# Patient Record
Sex: Female | Born: 1955
Health system: Southern US, Community
[De-identification: ages and names within clinical notes are randomized; demographics above are authoritative.]

## PROBLEM LIST (undated history)

## (undated) DIAGNOSIS — Z789 Other specified health status: Secondary | ICD-10-CM

## (undated) DIAGNOSIS — T7840XA Allergy, unspecified, initial encounter: Secondary | ICD-10-CM

## (undated) DIAGNOSIS — J45909 Unspecified asthma, uncomplicated: Secondary | ICD-10-CM

## (undated) HISTORY — PX: ABDOMINAL HYSTERECTOMY: SHX81

## (undated) HISTORY — DX: Allergy, unspecified, initial encounter: T78.40XA

---

## 2003-11-20 ENCOUNTER — Ambulatory Visit: Payer: Self-pay | Admitting: Obstetrics and Gynecology

## 2003-11-30 ENCOUNTER — Ambulatory Visit: Payer: Self-pay | Admitting: Internal Medicine

## 2003-12-07 ENCOUNTER — Ambulatory Visit: Payer: Self-pay | Admitting: Obstetrics and Gynecology

## 2005-09-25 ENCOUNTER — Ambulatory Visit: Payer: Self-pay

## 2006-10-23 ENCOUNTER — Ambulatory Visit: Payer: Self-pay | Admitting: Obstetrics and Gynecology

## 2006-10-26 ENCOUNTER — Ambulatory Visit: Payer: Self-pay | Admitting: Obstetrics and Gynecology

## 2007-07-13 ENCOUNTER — Ambulatory Visit: Payer: Self-pay | Admitting: Family Medicine

## 2007-10-26 ENCOUNTER — Ambulatory Visit: Payer: Self-pay | Admitting: Obstetrics and Gynecology

## 2008-11-07 ENCOUNTER — Ambulatory Visit: Payer: Self-pay | Admitting: Obstetrics and Gynecology

## 2009-05-30 ENCOUNTER — Encounter: Payer: Self-pay | Admitting: General Practice

## 2009-06-13 ENCOUNTER — Ambulatory Visit: Payer: Self-pay

## 2009-06-17 ENCOUNTER — Encounter: Payer: Self-pay | Admitting: General Practice

## 2009-11-13 ENCOUNTER — Ambulatory Visit: Payer: Self-pay | Admitting: Obstetrics and Gynecology

## 2010-12-10 ENCOUNTER — Ambulatory Visit: Payer: Self-pay | Admitting: Obstetrics and Gynecology

## 2011-12-11 ENCOUNTER — Ambulatory Visit: Payer: Self-pay | Admitting: Obstetrics and Gynecology

## 2013-02-01 ENCOUNTER — Ambulatory Visit: Payer: Self-pay | Admitting: Obstetrics and Gynecology

## 2015-01-24 ENCOUNTER — Other Ambulatory Visit: Payer: Self-pay | Admitting: Obstetrics and Gynecology

## 2015-01-24 DIAGNOSIS — Z1231 Encounter for screening mammogram for malignant neoplasm of breast: Secondary | ICD-10-CM

## 2015-01-24 LAB — HM PAP SMEAR: HM Pap smear: NEGATIVE

## 2015-02-01 ENCOUNTER — Ambulatory Visit
Admission: RE | Admit: 2015-02-01 | Discharge: 2015-02-01 | Disposition: A | Discharge status: Home / Self Care | Payer: BLUE CROSS/BLUE SHIELD | Source: Ambulatory Visit | Attending: Obstetrics and Gynecology | Admitting: Obstetrics and Gynecology

## 2015-02-01 DIAGNOSIS — Z1231 Encounter for screening mammogram for malignant neoplasm of breast: Secondary | ICD-10-CM | POA: Insufficient documentation

## 2015-04-12 ENCOUNTER — Encounter: Payer: Self-pay | Admitting: *Deleted

## 2015-04-13 ENCOUNTER — Ambulatory Visit: Payer: BLUE CROSS/BLUE SHIELD | Admitting: Anesthesiology

## 2015-04-13 ENCOUNTER — Ambulatory Visit
Admission: RE | Admit: 2015-04-13 | Discharge: 2015-04-13 | Disposition: A | Discharge status: Home / Self Care | Payer: BLUE CROSS/BLUE SHIELD | Source: Ambulatory Visit | Attending: Gastroenterology | Admitting: Gastroenterology

## 2015-04-13 ENCOUNTER — Encounter: Payer: Self-pay | Admitting: Anesthesiology

## 2015-04-13 ENCOUNTER — Encounter
Admission: RE | Disposition: A | Discharge status: Home / Self Care | Payer: Self-pay | Source: Ambulatory Visit | Attending: Gastroenterology

## 2015-04-13 DIAGNOSIS — G4730 Sleep apnea, unspecified: Secondary | ICD-10-CM | POA: Insufficient documentation

## 2015-04-13 DIAGNOSIS — Z79899 Other long term (current) drug therapy: Secondary | ICD-10-CM | POA: Insufficient documentation

## 2015-04-13 DIAGNOSIS — Z6841 Body Mass Index (BMI) 40.0 and over, adult: Secondary | ICD-10-CM | POA: Diagnosis not present

## 2015-04-13 DIAGNOSIS — Z7982 Long term (current) use of aspirin: Secondary | ICD-10-CM | POA: Insufficient documentation

## 2015-04-13 DIAGNOSIS — Z1211 Encounter for screening for malignant neoplasm of colon: Secondary | ICD-10-CM | POA: Insufficient documentation

## 2015-04-13 HISTORY — DX: Other specified health status: Z78.9

## 2015-04-13 HISTORY — PX: COLONOSCOPY WITH PROPOFOL: SHX5780

## 2015-04-13 SURGERY — COLONOSCOPY WITH PROPOFOL
Anesthesia: General

## 2015-04-13 MED ORDER — FENTANYL CITRATE (PF) 100 MCG/2ML IJ SOLN
INTRAMUSCULAR | Status: DC | PRN
  Administered 2015-04-13: 50 ug via INTRAVENOUS

## 2015-04-13 MED ORDER — SODIUM CHLORIDE 0.9 % IV SOLN
INTRAVENOUS | Status: DC
  Administered 2015-04-13: 1000 mL via INTRAVENOUS

## 2015-04-13 MED ORDER — MIDAZOLAM HCL 5 MG/5ML IJ SOLN
INTRAMUSCULAR | Status: DC | PRN
  Administered 2015-04-13: 1 mg via INTRAVENOUS

## 2015-04-13 MED ORDER — SODIUM CHLORIDE 0.9 % IV SOLN: INTRAVENOUS | Status: DC

## 2015-04-13 MED ORDER — PROPOFOL 500 MG/50ML IV EMUL
INTRAVENOUS | Status: DC | PRN
  Administered 2015-04-13: 120 ug/kg/min via INTRAVENOUS

## 2015-04-13 MED ORDER — PROPOFOL 10 MG/ML IV BOLUS
INTRAVENOUS | Status: DC | PRN
  Administered 2015-04-13: 40 mg via INTRAVENOUS

## 2015-04-13 NOTE — Anesthesia Postprocedure Evaluation (Signed)
Anesthesia Post Note  Patient: Meghan Leon  Procedure(s) Performed: Procedure(s) (LRB): COLONOSCOPY WITH PROPOFOL (N/A)  Patient location during evaluation: Endoscopy Anesthesia Type: General Level of consciousness: awake and alert Pain management: pain level controlled Vital Signs Assessment: post-procedure vital signs reviewed and stable Respiratory status: spontaneous breathing, nonlabored ventilation, respiratory function stable and patient connected to nasal cannula oxygen Cardiovascular status: blood pressure returned to baseline and stable Postop Assessment: no signs of nausea or vomiting Anesthetic complications: no    Last Vitals:  Filed Vitals:   04/13/15 1240 04/13/15 1250  BP: 150/94 147/98  Pulse: 67 65  Temp:    Resp: 15 15    Last Pain:  Filed Vitals:   04/13/15 1251  PainSc: Asleep                 Cleda Mccreedy Tyrian Peart

## 2015-04-13 NOTE — Anesthesia Preprocedure Evaluation (Signed)
Anesthesia Evaluation  Patient identified by MRN, date of birth, ID band Patient awake    Reviewed: Allergy & Precautions, H&P , NPO status , Patient's Chart, lab work & pertinent test results  History of Anesthesia Complications Negative for: history of anesthetic complications  Airway Mallampati: III  TM Distance: >3 FB Neck ROM: full    Dental  (+) Poor Dentition   Pulmonary neg pulmonary ROS, neg shortness of breath,    Pulmonary exam normal breath sounds clear to auscultation       Cardiovascular Exercise Tolerance: Good (-) angina(-) Past MI and (-) DOE negative cardio ROS Normal cardiovascular exam Rhythm:regular Rate:Normal     Neuro/Psych negative neurological ROS  negative psych ROS   GI/Hepatic negative GI ROS, Neg liver ROS, neg GERD  ,  Endo/Other  negative endocrine ROS  Renal/GU negative Renal ROS  negative genitourinary   Musculoskeletal   Abdominal   Peds  Hematology negative hematology ROS (+)   Anesthesia Other Findings Past Medical History:   Morbid obesity  Signs and symptoms suggestive of sleep apnea                              Past Surgical History:   CESAREAN SECTION                                             BMI    Body Mass Index   42.16 kg/m 2      Reproductive/Obstetrics negative OB ROS                             Anesthesia Physical Anesthesia Plan  ASA: III  Anesthesia Plan: General   Post-op Pain Management:    Induction:   Airway Management Planned:   Additional Equipment:   Intra-op Plan:   Post-operative Plan:   Informed Consent: I have reviewed the patients History and Physical, chart, labs and discussed the procedure including the risks, benefits and alternatives for the proposed anesthesia with the patient or authorized representative who has indicated his/her understanding and acceptance.   Dental Advisory Given  Plan  Discussed with: Anesthesiologist, CRNA and Surgeon  Anesthesia Plan Comments:         Anesthesia Quick Evaluation

## 2015-04-13 NOTE — H&P (Signed)
Outpatient short stay form Pre-procedure 04/13/2015 11:45 AM Christena Deem MD  Primary Physician: Dr. Jennell Corner  Reason for visit:  Screening colonoscopy  History of present illness:  Patient is a 60 year old female presenting today for colonoscopy. She had another colonoscopy in 2005 with no polyps. There is no family history of colon cancer colon polyps. She does take an 81 mg aspirin tablet has held that for number days. She takes no blood thinning products. She tolerated her prep well.    Current facility-administered medications:  .  0.9 %  sodium chloride infusion, , Intravenous, Continuous, Christena Deem, MD, Last Rate: 20 mL/hr at 04/13/15 1044, 1,000 mL at 04/13/15 1044 .  0.9 %  sodium chloride infusion, , Intravenous, Continuous, Christena Deem, MD  Prescriptions prior to admission  Medication Sig Dispense Refill Last Dose  . aspirin EC 81 MG tablet Take 81 mg by mouth daily.   04/12/2015 at Unknown time  . loratadine (CLARITIN) 10 MG tablet Take 10 mg by mouth daily as needed for allergies.   04/12/2015 at Unknown time     No Known Allergies   Past Medical History  Diagnosis Date  . Medical history non-contributory     Review of systems:      Physical Exam    Heart and lungs: Regular rate and rhythm without rub or gallop, lungs are bilaterally clear.    HEENT: Normocephalic atraumatic eyes are anicteric    Other:     Pertinant exam for procedure: Soft nontender nondistended bowel sounds positive normoactive.    Planned proceedures: Anoscopy and indicated procedures. I have discussed the risks benefits and complications of procedures to include not limited to bleeding, infection, perforation and the risk of sedation and the patient wishes to proceed.    Christena Deem, MD Gastroenterology 04/13/2015  11:45 AM

## 2015-04-13 NOTE — Op Note (Signed)
[facility] Gastroenterology Patient Name: David Towson Procedure Date: 04/13/2015 11:46 AM MRN: [id number] Account #: [id number] Date of Birth: August 19, 1955 Admit Type: Outpatient Age: 60 Room: [facility] ENDO ROOM 3 Gender: Female Note Status: Finalized Procedure:            Colonoscopy Indications:          Screening for colorectal malignant neoplasm Providers:            Christena Deem, MD Referring MD:         Suzy Bouchard, MD (Referring MD) Medicines:            Monitored Anesthesia Care Complications:        No immediate complications. Procedure:            Pre-Anesthesia Assessment:                       - ASA Grade Assessment: II - A patient with mild                        systemic disease.                       After obtaining informed consent, the colonoscope was                        passed under direct vision. Throughout the procedure,                        the patient's blood pressure, pulse, and oxygen                        saturations were monitored continuously. The                        Colonoscope was introduced through the anus and                        advanced to the the cecum, identified by appendiceal                        orifice and ileocecal valve. The colonoscopy was                        performed with moderate difficulty due to a tortuous                        colon. Successful completion of the procedure was aided                        by changing the patient to a prone position and using                        manual pressure. The quality of the bowel preparation                        was good. Findings:      The colon (entire examined portion) was moderately tortuous.      The digital rectal exam was normal.      The exam was otherwise without abnormality.      The retroflexed view of the  distal rectum and anal verge was normal and       showed no anal or rectal abnormalities. Impression:           - Tortuous  colon.                       - The examination was otherwise normal.                       - The distal rectum and anal verge are normal on                        retroflexion view.                       - No specimens collected. Recommendation:       - Discharge patient to home. Procedure Code(s):    --- Professional ---                       [phone], Colonoscopy, flexible; diagnostic, including                        collection of specimen(s) by brushing or washing, when                        performed (separate procedure) Diagnosis Code(s):    --- Professional ---                       Z12.11, Encounter for screening for malignant neoplasm                        of colon                       Q43.8, Other specified congenital malformations of                        intestine CPT copyright 2016 [employer]. All rights reserved. The codes documented in this report are preliminary and upon coder review may  be revised to meet current compliance requirements. Christena Deem, MD 04/13/2015 12:15:22 PM This report has been signed electronically. Number of Addenda: 0 Note Initiated On: 04/13/2015 11:46 AM Scope Withdrawal Time: 0 hours 5 minutes 58 seconds  Total Procedure Duration: 0 hours 19 minutes 33 seconds       [facility]

## 2015-04-13 NOTE — Transfer of Care (Signed)
Immediate Anesthesia Transfer of Care Note  Patient: Meghan Leon  Procedure(s) Performed: Procedure(s): COLONOSCOPY WITH PROPOFOL (N/A)  Patient Location: PACU and Endoscopy Unit  Anesthesia Type:General  Level of Consciousness: awake, alert  and patient cooperative  Airway & Oxygen Therapy: Patient Spontanous Breathing and Patient connected to nasal cannula oxygen  Post-op Assessment: Report given to RN and Post -op Vital signs reviewed and stable  Post vital signs: Reviewed and stable  Last Vitals:  Filed Vitals:   04/13/15 1220 04/13/15 1221  BP:    Pulse:    Temp: 36.2 C 36.2 C  Resp:      Complications: No apparent anesthesia complications

## 2015-04-18 ENCOUNTER — Encounter: Payer: Self-pay | Admitting: Gastroenterology

## 2015-11-09 ENCOUNTER — Other Ambulatory Visit: Payer: Self-pay | Admitting: Physician Assistant

## 2015-11-09 NOTE — Telephone Encounter (Signed)
spreadsheet

## 2016-08-12 ENCOUNTER — Other Ambulatory Visit: Payer: Self-pay | Admitting: Obstetrics and Gynecology

## 2016-08-12 DIAGNOSIS — I10 Essential (primary) hypertension: Secondary | ICD-10-CM | POA: Diagnosis not present

## 2016-08-12 DIAGNOSIS — Z01419 Encounter for gynecological examination (general) (routine) without abnormal findings: Secondary | ICD-10-CM | POA: Diagnosis not present

## 2016-08-12 DIAGNOSIS — Z1231 Encounter for screening mammogram for malignant neoplasm of breast: Secondary | ICD-10-CM | POA: Diagnosis not present

## 2016-08-12 DIAGNOSIS — Z1211 Encounter for screening for malignant neoplasm of colon: Secondary | ICD-10-CM | POA: Diagnosis not present

## 2016-08-19 DIAGNOSIS — H524 Presbyopia: Secondary | ICD-10-CM | POA: Diagnosis not present

## 2016-08-19 DIAGNOSIS — H5213 Myopia, bilateral: Secondary | ICD-10-CM | POA: Diagnosis not present

## 2016-08-19 DIAGNOSIS — H52223 Regular astigmatism, bilateral: Secondary | ICD-10-CM | POA: Diagnosis not present

## 2016-09-02 ENCOUNTER — Ambulatory Visit
Admission: RE | Admit: 2016-09-02 | Discharge: 2016-09-02 | Disposition: A | Discharge status: Home / Self Care | Payer: 59 | Source: Ambulatory Visit | Attending: Obstetrics and Gynecology | Admitting: Obstetrics and Gynecology

## 2016-09-02 DIAGNOSIS — Z1231 Encounter for screening mammogram for malignant neoplasm of breast: Secondary | ICD-10-CM | POA: Insufficient documentation

## 2017-02-14 DIAGNOSIS — J019 Acute sinusitis, unspecified: Secondary | ICD-10-CM | POA: Diagnosis not present

## 2017-02-14 DIAGNOSIS — H66003 Acute suppurative otitis media without spontaneous rupture of ear drum, bilateral: Secondary | ICD-10-CM | POA: Diagnosis not present

## 2017-02-14 DIAGNOSIS — H8309 Labyrinthitis, unspecified ear: Secondary | ICD-10-CM | POA: Diagnosis not present

## 2017-02-14 DIAGNOSIS — J302 Other seasonal allergic rhinitis: Secondary | ICD-10-CM | POA: Diagnosis not present

## 2017-03-03 ENCOUNTER — Ambulatory Visit (INDEPENDENT_AMBULATORY_CARE_PROVIDER_SITE_OTHER): Payer: Self-pay | Admitting: Emergency Medicine

## 2017-03-03 VITALS — BP 154/90 | HR 87 | Temp 97.7°F | Wt 175.0 lb

## 2017-03-03 DIAGNOSIS — R42 Dizziness and giddiness: Secondary | ICD-10-CM

## 2017-03-03 DIAGNOSIS — H8301 Labyrinthitis, right ear: Secondary | ICD-10-CM

## 2017-03-03 MED ORDER — PREDNISONE 10 MG PO TABS: ORAL_TABLET | ORAL | 0 refills | Status: DC

## 2017-03-03 MED ORDER — MECLIZINE HCL 25 MG PO TABS: 25.0000 mg | ORAL_TABLET | Freq: Four times a day (QID) | ORAL | 0 refills | Status: DC | PRN

## 2017-03-03 NOTE — Progress Notes (Signed)
Subjective:     Meghan Leon is a 62 y.o. female who presents for evaluation of dizziness. The symptoms started 3 weeks ago and are initially improved after treatment for sinusitis, but worsened over last 4 days. The attacks occur constantly and last 15 minutes. Positions that worsen symptoms: any motion, lying down and turning head. Previous workup/treatments: none and treatment of sinusitis with Cefdinir and Flonase. Associated ear symptoms: aural pressure otalgia blurred vision. Associated CNS symptoms: none. Recent infections: sinus infection. Head trauma: denied. Drug ingestion: none. Noise exposure: no occupational exposure. Family history: non-contributory.  The following portions of the patient's history were reviewed and updated as appropriate: allergies and current medications.  Review of Systems Pertinent items noted in HPI and remainder of comprehensive ROS otherwise negative.    Objective:    BP (!) 154/90   Pulse 87   Temp 97.7 F (36.5 C)   Wt 175 lb (79.4 kg)   SpO2 96%   BMI 42.19 kg/m  General appearance: alert, cooperative and appears stated age Head: Normocephalic, without obvious abnormality, atraumatic, sinuses nontender to percussion Eyes: negative findings: lids and lashes normal, conjunctivae and sclerae normal, pupils equal, round, reactive to light and accomodation, visual fields full to confrontation and optic nerve appearance unremarkable, positive findings: Nystagmus with Gilberto Betterix Hallpike manuever , Fundescopic exam unremarkable Ears: normal TM's and external ear canals both ears Nose: right turbinate swollen, small, ulcerated leison in right nare Throat: lips, mucosa, and tongue normal; teeth and gums normal Neck: no adenopathy, no JVD and supple, symmetrical, trachea midline Lungs: clear to auscultation bilaterally Heart: regular rate and rhythm Extremities: extremities normal, atraumatic, no cyanosis or edema Lymph nodes: No cervical  adenopathy Neurologic: Alert and oriented X 3, normal strength and tone. Normal symmetric reflexes. Normal coordination and gait Cranial nerves: II: visual acuity grossly normal bilaterally, II: visual field normal, II: pupils equal, round, reactive to light and accommodation, III,IV,VI: extraocular muscles extra-ocular motions intact, V: mastication normal, V: facial light touch sensation normal bilaterally, V,VII: corneal reflex present bilaterally, VII: upper facial muscle function normal bilaterally, VII: lower facial muscle function normal bilaterally, VIII: hearing normal, IX: soft palate elevation normal in midline, XI: trapezius strength normal bilaterally, XI: sternocleidomastoid strength normal bilaterally, XI: neck flexion strength normal, XII: tongue strength deferred       Assessment:    Acute labyrinthitis and Vertigo    Plan:    Meclizine per medication orders. Steroids per medication orders. OTC decongestants. The nature of vertigo syndromes were discussed along with their usual course and treatment. Educational materials given and questions answered. work note provided, follow up with ENT if symptoms persist

## 2017-03-03 NOTE — Patient Instructions (Signed)

## 2017-08-12 ENCOUNTER — Other Ambulatory Visit: Payer: Self-pay | Admitting: Obstetrics and Gynecology

## 2017-08-12 DIAGNOSIS — Z1231 Encounter for screening mammogram for malignant neoplasm of breast: Secondary | ICD-10-CM

## 2017-08-18 DIAGNOSIS — I10 Essential (primary) hypertension: Secondary | ICD-10-CM | POA: Diagnosis not present

## 2017-08-18 DIAGNOSIS — Z1231 Encounter for screening mammogram for malignant neoplasm of breast: Secondary | ICD-10-CM | POA: Diagnosis not present

## 2017-08-18 DIAGNOSIS — E669 Obesity, unspecified: Secondary | ICD-10-CM | POA: Diagnosis not present

## 2017-08-18 DIAGNOSIS — Z01419 Encounter for gynecological examination (general) (routine) without abnormal findings: Secondary | ICD-10-CM | POA: Diagnosis not present

## 2017-08-18 LAB — LIPID PANEL
Cholesterol: 211 — AB (ref 0–200)
HDL: 42 (ref 35–70)
LDL Cholesterol: 141
LDl/HDL Ratio: 5.1
Triglycerides: 143 (ref 40–160)

## 2017-08-18 LAB — BASIC METABOLIC PANEL
BUN: 15 (ref 4–21)
Creatinine: 1 (ref 0.5–1.1)
Glucose: 88
Potassium: 4.2 (ref 3.4–5.3)
Sodium: 143 (ref 137–147)

## 2017-08-18 LAB — TSH: TSH: 1.95 (ref 0.41–5.90)

## 2017-08-31 DIAGNOSIS — Z1211 Encounter for screening for malignant neoplasm of colon: Secondary | ICD-10-CM | POA: Diagnosis not present

## 2017-09-04 ENCOUNTER — Ambulatory Visit
Admission: RE | Admit: 2017-09-04 | Discharge: 2017-09-04 | Disposition: A | Discharge status: Home / Self Care | Payer: 59 | Source: Ambulatory Visit | Attending: Obstetrics and Gynecology | Admitting: Obstetrics and Gynecology

## 2017-09-04 DIAGNOSIS — Z1231 Encounter for screening mammogram for malignant neoplasm of breast: Secondary | ICD-10-CM | POA: Insufficient documentation

## 2018-10-12 ENCOUNTER — Other Ambulatory Visit: Payer: Self-pay | Admitting: Obstetrics and Gynecology

## 2018-10-12 DIAGNOSIS — Z1231 Encounter for screening mammogram for malignant neoplasm of breast: Secondary | ICD-10-CM

## 2018-11-15 ENCOUNTER — Ambulatory Visit
Admission: RE | Admit: 2018-11-15 | Discharge: 2018-11-15 | Disposition: A | Discharge status: Home / Self Care | Payer: 59 | Source: Ambulatory Visit | Attending: Obstetrics and Gynecology | Admitting: Obstetrics and Gynecology

## 2018-11-15 DIAGNOSIS — Z1231 Encounter for screening mammogram for malignant neoplasm of breast: Secondary | ICD-10-CM | POA: Insufficient documentation

## 2019-02-16 ENCOUNTER — Institutional Professional Consult (permissible substitution): Payer: 59 | Admitting: Pulmonary Disease

## 2019-02-18 DIAGNOSIS — I10 Essential (primary) hypertension: Secondary | ICD-10-CM

## 2019-02-18 HISTORY — DX: Essential (primary) hypertension: I10

## 2019-03-09 ENCOUNTER — Other Ambulatory Visit: Payer: Self-pay

## 2019-03-09 ENCOUNTER — Emergency Department
Admission: EM | Admit: 2019-03-09 | Discharge: 2019-03-09 | Disposition: A | Discharge status: Home / Self Care | Payer: 59 | Attending: Emergency Medicine | Admitting: Emergency Medicine

## 2019-03-09 ENCOUNTER — Encounter: Payer: Self-pay | Admitting: Emergency Medicine

## 2019-03-09 ENCOUNTER — Emergency Department: Payer: 59

## 2019-03-09 ENCOUNTER — Telehealth: Payer: 59

## 2019-03-09 ENCOUNTER — Ambulatory Visit
Admission: EM | Admit: 2019-03-09 | Discharge: 2019-03-09 | Disposition: A | Discharge status: Other Acute Inpatient Hospital | Payer: 59 | Attending: Emergency Medicine | Admitting: Emergency Medicine

## 2019-03-09 DIAGNOSIS — R200 Anesthesia of skin: Secondary | ICD-10-CM | POA: Insufficient documentation

## 2019-03-09 DIAGNOSIS — Z7982 Long term (current) use of aspirin: Secondary | ICD-10-CM | POA: Diagnosis not present

## 2019-03-09 DIAGNOSIS — I10 Essential (primary) hypertension: Secondary | ICD-10-CM | POA: Insufficient documentation

## 2019-03-09 DIAGNOSIS — R202 Paresthesia of skin: Secondary | ICD-10-CM

## 2019-03-09 DIAGNOSIS — I160 Hypertensive urgency: Secondary | ICD-10-CM | POA: Diagnosis not present

## 2019-03-09 DIAGNOSIS — J45998 Other asthma: Secondary | ICD-10-CM | POA: Insufficient documentation

## 2019-03-09 HISTORY — DX: Unspecified asthma, uncomplicated: J45.909

## 2019-03-09 LAB — BASIC METABOLIC PANEL
Anion gap: 9 (ref 5–15)
BUN: 14 mg/dL (ref 8–23)
CO2: 28 mmol/L (ref 22–32)
Calcium: 9.7 mg/dL (ref 8.9–10.3)
Chloride: 104 mmol/L (ref 98–111)
Creatinine, Ser: 0.99 mg/dL (ref 0.44–1.00)
GFR calc Af Amer: >60 mL/min (ref >60)
GFR calc non Af Amer: >60 mL/min (ref >60)
Glucose, Bld: 101 mg/dL — ABNORMAL HIGH (ref 70–99)
Potassium: 4.2 mmol/L (ref 3.5–5.1)
Sodium: 141 mmol/L (ref 135–145)

## 2019-03-09 LAB — CBC
HCT: 45.4 % (ref 36.0–46.0)
Hemoglobin: 14.2 g/dL (ref 12.0–15.0)
MCH: 24.8 pg — ABNORMAL LOW (ref 26.0–34.0)
MCHC: 31.3 g/dL (ref 30.0–36.0)
MCV: 79.4 fL — ABNORMAL LOW (ref 80.0–100.0)
Platelets: 282 10*3/uL (ref 150–400)
RBC: 5.72 MIL/uL — ABNORMAL HIGH (ref 3.87–5.11)
RDW: 15.1 % (ref 11.5–15.5)
WBC: 9 10*3/uL (ref 4.0–10.5)
nRBC: 0 % (ref 0.0–0.2)

## 2019-03-09 LAB — TROPONIN I (HIGH SENSITIVITY): Troponin I (High Sensitivity): 3 ng/L (ref <18)

## 2019-03-09 MED ORDER — HYDROCHLOROTHIAZIDE 25 MG PO TABS: 25.0000 mg | ORAL_TABLET | Freq: Every day | ORAL | 2 refills | Status: DC

## 2019-03-09 NOTE — ED Triage Notes (Signed)
Patient complains of intermittant left arm numbness. Patient states that she has noticed this since yesterday. Reports that her BP was elevated today which prompted her to come in.

## 2019-03-09 NOTE — ED Provider Notes (Signed)
Meghan Leon    CSN: [id number] Arrival date & time: 03/09/19  1341      History   Chief Complaint Chief Complaint  Patient presents with  . Numbness    HPI Meghan Leon is a 64 y.o. female.   Patient presents with left arm numbness and elevated blood pressure since yesterday.  She denies chest pain, shortness of breath, focal weakness, dizziness, palpitations, or other symptoms.  No treatments attempted at home.  The history is provided by the patient.    Past Medical History:  Diagnosis Date  . Medical history non-contributory     There are no problems to display for this patient.   Past Surgical History:  Procedure Laterality Date  . CESAREAN SECTION    . COLONOSCOPY WITH PROPOFOL N/A 04/13/2015   Procedure: COLONOSCOPY WITH PROPOFOL;  Surgeon: Christena Deem, MD;  Location: [facility] ENDOSCOPY;  Service: Endoscopy;  Laterality: N/A;    OB History   No obstetric history on file.      Home Medications    Prior to Admission medications   Medication Sig Start Date End Date Taking? Authorizing Provider  aspirin EC 81 MG tablet Take 81 mg by mouth daily.   Yes [provider]  ADVAIR [facility] [phone] MCG/ACT inhaler INHALE 2 PUFFS TWICE A DAY 11/09/15   Fisher, Roselyn Bering, PA-C  loratadine (CLARITIN) 10 MG tablet Take 10 mg by mouth daily as needed for allergies.    [provider]  meclizine (ANTIVERT) 25 MG tablet Take 1 tablet (25 mg total) by mouth 4 (four) times daily as needed for dizziness. 03/03/17   Dorena Bodo, NP  predniSONE (DELTASONE) 10 MG tablet Take 6 tablets a day for three days, then decrease by one tablet daily till finished (6,6,6,5,4,3,2,1) 03/03/17   Dorena Bodo, NP    Family History Family History  Problem Relation Age of Onset  . Breast cancer Daughter 40  . Alzheimer's disease Mother   . Cancer Father     Social History Social History   Tobacco Use  . Smoking status: Never Smoker  . Smokeless  tobacco: Never Used  Substance Use Topics  . Alcohol use: No  . Drug use: No     Allergies   Patient has no known allergies.   Review of Systems Review of Systems  Constitutional: Negative for chills and fever.  HENT: Negative for ear pain and sore throat.   Eyes: Negative for pain and visual disturbance.  Respiratory: Negative for cough and shortness of breath.   Cardiovascular: Negative for chest pain and palpitations.  Gastrointestinal: Negative for abdominal pain and vomiting.  Genitourinary: Negative for dysuria and hematuria.  Musculoskeletal: Negative for arthralgias and back pain.  Skin: Negative for color change and rash.  Neurological: Positive for numbness. Negative for dizziness, seizures, syncope, facial asymmetry, speech difficulty, weakness and headaches.  All other systems reviewed and are negative.    Physical Exam Triage Vital Signs ED Triage Vitals  Enc Vitals Group     BP      Pulse      Resp      Temp      Temp src      SpO2      Weight      Height      Head Circumference      Peak Flow      Pain Score      Pain Loc      Pain Edu?  Excl. in [place]?    No data found.  Updated Vital Signs BP (!) 190/116 (BP Location: Left Arm)   Pulse 73   Temp 98.6 F (37 C) (Oral)   Resp 17   Ht 4\' 9"  (3.893 m)   Wt 180 lb (81.6 kg)   SpO2 95%   BMI 38.95 kg/m   Visual Acuity Right Eye Distance:   Left Eye Distance:   Bilateral Distance:    Right Eye Near:   Left Eye Near:    Bilateral Near:     Physical Exam Vitals and nursing note reviewed.  Constitutional:      General: She is not in acute distress.    Appearance: She is well-developed. She is not ill-appearing.  HENT:     Head: Normocephalic and atraumatic.     Mouth/Throat:     Mouth: Mucous membranes are moist.     Pharynx: Oropharynx is clear.  Eyes:     Conjunctiva/sclera: Conjunctivae normal.     Pupils: Pupils are equal, round, and reactive to light.  Cardiovascular:      Rate and Rhythm: Normal rate and regular rhythm.     Heart sounds: Normal heart sounds. No murmur.  Pulmonary:     Effort: Pulmonary effort is normal. No respiratory distress.     Breath sounds: Normal breath sounds.  Abdominal:     General: Bowel sounds are normal.     Palpations: Abdomen is soft.     Tenderness: There is no abdominal tenderness. There is no guarding or rebound.  Musculoskeletal:     Cervical back: Neck supple.     Right lower leg: No edema.     Left lower leg: No edema.  Skin:    General: Skin is warm and dry.     Findings: No rash.  Neurological:     General: No focal deficit present.     Mental Status: She is alert and oriented to person, place, and time.     Sensory: No sensory deficit.     Motor: No weakness.     Gait: Gait normal.  Psychiatric:        Mood and Affect: Mood normal.        Behavior: Behavior normal.      UC Treatments / Results  Labs (all labs ordered are listed, but only abnormal results are displayed) Labs Reviewed - No data to display  EKG   Radiology No results found.  Procedures Procedures (including critical care time)  Medications Ordered in UC Medications - No data to display  Initial Impression / Assessment and Plan / UC Course  I have reviewed the triage vital signs and the nursing notes.  Pertinent labs & imaging results that were available during my care of the patient were reviewed by me and considered in my medical decision making (see chart for details).   Hypertensive urgency, left arm numbness.  BP 190/116.  Sending patient to the emergency department for evaluation.  EKG shows sinus rhythm, rate 75, no ST elevation, no previous to compare.  Patient refuses EMS; her husband drove her here and will drive her to the emergency department.      Final Clinical Impressions(s) / UC Diagnoses   Final diagnoses:  Hypertensive urgency  Left arm numbness     Discharge Instructions     Go to the emergency  room for evaluation of your elevated blood pressure and left arm numbness.  Go directly there.      ED  Prescriptions    None     PDMP not reviewed this encounter.   Mickie Bail, NP 03/09/19 1424

## 2019-03-09 NOTE — ED Triage Notes (Addendum)
Pt in via POV, sent from UC due to elevated BP.  Pt also complains of left arm numbness since yesterday.  Pt denies any headache, dizziness, or changes to vision.  Pt recently lost her daughter to cancer, tearful in triage, states, "I just cant relax."  Pt is neurologically intact.  NAD noted at this time.

## 2019-03-09 NOTE — ED Provider Notes (Signed)
[facility] Emergency Department Provider Note  Time seen: 5:04 PM  I have reviewed the triage vital signs and the nursing notes.   HISTORY  Chief Complaint Hypertension and Numbness   HPI Meghan Leon is a 64 y.o. female with a past medical history of asthma, presents to the emergency department for hypertension and left arm tingling.  According to the patient she states yesterday she was experiencing some tingling in her left arm, checked her blood pressure and it was elevated which concerned her.  States she tried to relax and check it again today and it was still elevated although the patient denies any numbness or tingling today.  Denies any headache at any point.  Denies any weakness slurred speech or confusion at any point.  Denies any recent illness fever cough congestion or shortness of breath.  Past Medical History:  Diagnosis Date  . Asthma   . Medical history non-contributory     There are no problems to display for this patient.   Past Surgical History:  Procedure Laterality Date  . CESAREAN SECTION    . COLONOSCOPY WITH PROPOFOL N/A 04/13/2015   Procedure: COLONOSCOPY WITH PROPOFOL;  Surgeon: Christena Deem, MD;  Location: [facility] ENDOSCOPY;  Service: Endoscopy;  Laterality: N/A;    Prior to Admission medications   Medication Sig Start Date End Date Taking? Authorizing Provider  ADVAIR [facility] 115-21 MCG/ACT inhaler INHALE 2 PUFFS TWICE A DAY 11/09/15   Sherrie Mustache Roselyn Bering, PA-C  aspirin EC 81 MG tablet Take 81 mg by mouth daily.    [provider]  loratadine (CLARITIN) 10 MG tablet Take 10 mg by mouth daily as needed for allergies.    [provider]  meclizine (ANTIVERT) 25 MG tablet Take 1 tablet (25 mg total) by mouth 4 (four) times daily as needed for dizziness. 03/03/17   Dorena Bodo, NP  predniSONE (DELTASONE) 10 MG tablet Take 6 tablets a day for three days, then decrease by one tablet daily till finished  (6,6,6,5,4,3,2,1) 03/03/17   Dorena Bodo, NP    No Known Allergies  Family History  Problem Relation Age of Onset  . Breast cancer Daughter 44  . Alzheimer's disease Mother   . Cancer Father     Social History Social History   Tobacco Use  . Smoking status: Never Smoker  . Smokeless tobacco: Never Used  Substance Use Topics  . Alcohol use: No  . Drug use: No    Review of Systems Constitutional: Negative for fever. Cardiovascular: Negative for chest pain. Respiratory: Negative for shortness of breath. Gastrointestinal: Negative for abdominal pain, vomiting Musculoskeletal: Negative for musculoskeletal complaints Neurological: Negative for headache All other ROS negative  ____________________________________________   PHYSICAL EXAM:  VITAL SIGNS: ED Triage Vitals [03/09/19 1447]  Enc Vitals Group     BP (!) 183/107     Pulse Rate 83     Resp 16     Temp 98.6 F (37 C)     Temp Source Oral     SpO2 98 %     Weight      Height      Head Circumference      Peak Flow      Pain Score 0     Pain Loc      Pain Edu?      Excl. in GC?    Constitutional: Alert and oriented. Well appearing and in no distress. Eyes: Normal exam ENT  Head: Normocephalic and atraumatic.      Mouth/Throat: Mucous membranes are moist. Cardiovascular: Normal rate, regular rhythm.  Respiratory: Normal respiratory effort without tachypnea nor retractions. Breath sounds are clear  Gastrointestinal: Soft and nontender. No distention.  Musculoskeletal: Nontender with normal range of motion in all extremities.  Neurologic:  Normal speech and language. No gross focal neurologic deficits.  Equal grip strength bilaterally.  5/5 motor in all extremities.  No pronator drift.  No lower extremity drift. Skin:  Skin is warm, dry and intact.  Psychiatric: Mood and affect are normal.   ____________________________________________    EKG  EKG viewed and interpreted by myself shows a  normal sinus rhythm at 76 bpm with a narrow QRS, normal axis, normal intervals, no concerning ST changes.  ____________________________________________    RADIOLOGY  CT negative for acute abnormality  ____________________________________________   INITIAL IMPRESSION / ASSESSMENT AND PLAN / ED COURSE  Pertinent labs & imaging results that were available during my care of the patient were reviewed by me and considered in my medical decision making (see chart for details).   Patient presents to the emergency department for hypertension and tingling/paresthesias of her left arm yesterday.  Patient denies any numbness or weakness today denies any tingling today.  Currently the patient appears well, she is mildly hypertensive her lab work is reassuring.  Patient has a completely intact and normal neurological exam.  We will obtain a CT scan of the head as a precaution.  Given the patient's hypertension I believe we should start the patient on a antihypertensive such as hydrochlorothiazide.  Patient states last year she was told that she was hypertensive but with diet and exercise her blood pressure came down near normal and her doctor held off on starting her on medication.  States it has come up over the last few months.  Patient has a cuff and checks her blood pressure fairly regularly.  CT negative for acute abnormality.  Patient continues to appear extremely well in the emergency department.  We will discharge with PCP follow-up and start the patient on hydrochlorothiazide.  Patient agreeable to plan of care.  Meghan Leon was evaluated in Emergency Department on 03/09/2019 for the symptoms described in the history of present illness. She was evaluated in the context of the global COVID-19 pandemic, which necessitated consideration that the patient might be at risk for infection with the SARS-CoV-2 virus that causes COVID-19. Institutional protocols and algorithms that pertain to the evaluation  of patients at risk for COVID-19 are in a state of rapid change based on information released by regulatory bodies including the CDC and federal and state organizations. These policies and algorithms were followed during the patient's care in the ED.  ____________________________________________   FINAL CLINICAL IMPRESSION(S) / ED DIAGNOSES  Hypertension Paresthesia   Harvest Dark, MD 03/09/19 [phone]

## 2019-03-09 NOTE — Discharge Instructions (Addendum)
Go to the emergency room for evaluation of your elevated blood pressure and left arm numbness.  Go directly there.

## 2019-03-11 ENCOUNTER — Ambulatory Visit: Payer: 59 | Admitting: Pulmonary Disease

## 2019-03-11 ENCOUNTER — Ambulatory Visit
Admission: RE | Admit: 2019-03-11 | Discharge: 2019-03-11 | Disposition: A | Discharge status: Home / Self Care | Payer: 59 | Source: Ambulatory Visit | Attending: Pulmonary Disease | Admitting: Pulmonary Disease

## 2019-03-11 ENCOUNTER — Other Ambulatory Visit: Payer: Self-pay

## 2019-03-11 ENCOUNTER — Encounter: Payer: Self-pay | Admitting: Pulmonary Disease

## 2019-03-11 VITALS — BP 146/98 | HR 81 | Temp 97.5°F | Ht 64.0 in | Wt 184.4 lb

## 2019-03-11 DIAGNOSIS — R0602 Shortness of breath: Secondary | ICD-10-CM | POA: Diagnosis not present

## 2019-03-11 DIAGNOSIS — I10 Essential (primary) hypertension: Secondary | ICD-10-CM

## 2019-03-11 DIAGNOSIS — J45901 Unspecified asthma with (acute) exacerbation: Secondary | ICD-10-CM

## 2019-03-11 DIAGNOSIS — J45909 Unspecified asthma, uncomplicated: Secondary | ICD-10-CM

## 2019-03-11 DIAGNOSIS — Z7689 Persons encountering health services in other specified circumstances: Secondary | ICD-10-CM | POA: Diagnosis not present

## 2019-03-11 MED ORDER — BREO ELLIPTA 100-25 MCG/INH IN AEPB: 1.0000 | INHALATION_SPRAY | Freq: Every day | RESPIRATORY_TRACT | 6 refills | Status: DC

## 2019-03-11 MED ORDER — ALBUTEROL SULFATE HFA 108 (90 BASE) MCG/ACT IN AERS: 2.0000 | INHALATION_SPRAY | Freq: Four times a day (QID) | RESPIRATORY_TRACT | 3 refills | Status: DC | PRN

## 2019-03-11 NOTE — Patient Instructions (Addendum)
1.  We will switch your Advair to Breo Ellipta 100/25 this is basically longer acting and you will need to take 1 puff daily.  Rinse your mouth well after use.   2.  You also received a emergency inhaler that we have used 2 puffs every 6 hours AS NEEDED for shortness of breath or wheezing.   3.  You have been referred to internal medicine (primary care) so that you can be assigned a regular physician.  4.  You will have a chest x-ray performed.  5.  We will see you in follow-up in 3 months time call sooner should any new difficulties arise.

## 2019-03-11 NOTE — Progress Notes (Signed)
Subjective:    Patient ID: Meghan Leon, female    DOB: July 24, 1955, 64 y.o.   MRN: [id number]  HPI Meghan Leon is a 65 year old lifelong never smoker who presents for evaluation of occasional shortness of breath with activity and for management of previously diagnosed asthma.  Symptoms have been present for approximately 2 months.  She is self-referred.  Has been getting her care mostly through the employee health "[employer]od[employer] Binet" which now is not longer active.  She does not have a regular physician.  She had previously been diagnosed with asthma and been prescribed Advair by [facility].  She clearly notices allergic triggers as well as frequent sinus infection triggers.  She has not had a recent exacerbation.  Advair helps with her dyspnea issues.  She states that she was diagnosed approximately 10 years ago and her rescue inhalers and maintenance inhalers do help her issues.  She has recently been started on antihypertensive medication.  Her prescription for her inhalers ran out and her ENT would not refill it that advised her to procure a pulmonologist.  She has not had any fevers, chills or sweats.  No orthopnea or paroxysmal nocturnal dyspnea no other complaints.  Social history, surgical history and family history of been reviewed.  They are as noted.  Patient will history she does not have significant occupational exposure though she works in a clinical setting in a clerical capacity.  She does not have any pets in the home no unusual hobbies.  She has lived in [place] all her life.  No military history.  No travel outside the country.  No positive TB testing with frequent TB testing due to employment requirement.  She does not have any issues with gastroesophageal reflux.   Review of Systems A 10 point review of systems was performed and it is as noted above otherwise negative.    Objective:   Physical Exam BP (!) 146/98 (BP Location: Left Arm, Patient Position: Sitting, Cuff  Size: Large)   Pulse 81   Temp (!) 97.5 F (36.4 C) (Temporal)   Ht 5\' 4"  (1.626 m)   Wt 184 lb 6.4 oz (83.6 kg)   SpO2 95% Comment: on ra  BMI 31.65 kg/m  GENERAL: Awake, alert, fully ambulatory.  No distress.  Well-developed well-nourished. HEAD: Normocephalic, atraumatic.  EYES: Pupils equal, round, reactive to light.  No scleral icterus.  MOUTH: Nose/mouth/throat not examined due to masking requirements for COVID 19.   NECK: Supple. No thyromegaly. No nodules. No JVD.  Trachea midline.  No crepitus. PULMONARY:  Good air entry bilaterally, coarse breath sounds bilaterally with no other adventitious sounds. CARDIOVASCULAR: S1 and S2. Regular rate and rhythm.  No rubs or gallops heard. GASTROINTESTINAL: No distention. MUSCULOSKELETAL: No joint deformity, no clubbing, no edema.  NEUROLOGIC: Awake, alert, no focal deficits.  Fluent speech. SKIN: Intact,warm,dry.  No rashes noted. PSYCH: Mood and behavior appropriate.  Has just started blood pressure medication namely HCTZ.  She just ran out of her Advair HFA 115/21 2 inhalations twice a day.  She is on as needed albuterol.    Assessment & Plan:   Persistent asthma without complication, unknown asthma severity Shortness of breath Will need PFTs in the future, currently not being performed due to COVID-19 restrictions Switch Advair to Breo Ellipta 100/25 1 puff daily Prescribed albuterol rescue inhaler Chest x-ray PA and lateral today Follow-up in 3 months time call sooner should any new difficulties arise  Essential hypertension Referral to primary care for  management  C. Danice Goltz, MD [place] [facility]  *This note was dictated using voice recognition software/Dragon.  Despite best efforts to proofread, errors can occur which can change the meaning.  Any change was purely unintentional.

## 2019-03-14 ENCOUNTER — Telehealth: Payer: 59 | Admitting: Family

## 2019-03-14 ENCOUNTER — Other Ambulatory Visit: Payer: Self-pay

## 2019-03-14 DIAGNOSIS — J189 Pneumonia, unspecified organism: Secondary | ICD-10-CM

## 2019-03-14 DIAGNOSIS — R42 Dizziness and giddiness: Secondary | ICD-10-CM

## 2019-03-14 MED ORDER — AZITHROMYCIN 250 MG PO TABS: ORAL_TABLET | ORAL | 0 refills | Status: DC

## 2019-03-14 NOTE — Progress Notes (Signed)
Based on what you shared with me, I feel your condition warrants further evaluation and I recommend that you be seen for a face to face office visit.  Given you are being treated for pneumonia and dizziness, you need to be seen face to face to rule out dehydration.    NOTE: If you entered your credit card information for this eVisit, you will not be charged. You may see a "hold" on your card for the $35 but that hold will drop off and you will not have a charge processed.   If you are having a true medical emergency please call 911.      For an urgent face to face visit, [facility] has five urgent care centers for your convenience:      NEW:  [facility] at [facility] Directions [phone] [address] [place], [place] [address] . 10 am - 6pm Monday - Friday    [facility]) Get Driving Directions [phone] [address] [place], [place] [address] . 10 am to 8 pm Monday-Friday . 12 pm to 8 pm [facility] Urgent Care at [facility] Get Driving Directions [phone] [address] [place], [place] [address] . 8 am to 8 pm Monday-Friday . 9 am to 6 pm Saturday . 11 am to 6 pm Sunday     [facility] at [facility] Get Driving Directions  [phone] [address] [place], [place] [address] . 8 am to 8 pm Monday-Friday . 8 am to 4 pm [facility] at [facility] Directions [phone] [address] [place], [place] [address] . 12 pm to 6 pm Monday-Friday      Your e-visit answers were reviewed by a board certified advanced clinical practitioner to complete your personal care plan.  Thank you for using e-Visits.

## 2019-04-06 ENCOUNTER — Other Ambulatory Visit: Payer: Self-pay

## 2019-04-06 ENCOUNTER — Encounter: Payer: Self-pay | Admitting: Family Medicine

## 2019-04-06 ENCOUNTER — Ambulatory Visit: Payer: 59 | Admitting: Family Medicine

## 2019-04-06 VITALS — BP 128/78 | HR 84 | Temp 97.8°F | Resp 12 | Ht 63.5 in | Wt 183.5 lb

## 2019-04-06 DIAGNOSIS — E782 Mixed hyperlipidemia: Secondary | ICD-10-CM

## 2019-04-06 DIAGNOSIS — Z114 Encounter for screening for human immunodeficiency virus [HIV]: Secondary | ICD-10-CM

## 2019-04-06 DIAGNOSIS — I10 Essential (primary) hypertension: Secondary | ICD-10-CM

## 2019-04-06 DIAGNOSIS — J45909 Unspecified asthma, uncomplicated: Secondary | ICD-10-CM | POA: Insufficient documentation

## 2019-04-06 DIAGNOSIS — Z1159 Encounter for screening for other viral diseases: Secondary | ICD-10-CM

## 2019-04-06 DIAGNOSIS — E785 Hyperlipidemia, unspecified: Secondary | ICD-10-CM | POA: Insufficient documentation

## 2019-04-06 DIAGNOSIS — J4520 Mild intermittent asthma, uncomplicated: Secondary | ICD-10-CM

## 2019-04-06 LAB — LIPID PANEL
Cholesterol: 205 mg/dL — ABNORMAL HIGH (ref 0–200)
HDL: 32 mg/dL — ABNORMAL LOW (ref >39.00)
LDL Cholesterol: 136 mg/dL — ABNORMAL HIGH (ref 0–99)
NonHDL: 173.43
Total CHOL/HDL Ratio: 6
Triglycerides: 188 mg/dL — ABNORMAL HIGH (ref 0.0–149.0)
VLDL: 37.6 mg/dL (ref 0.0–40.0)

## 2019-04-06 NOTE — Assessment & Plan Note (Signed)
BP at goal. Cont HCTZ 

## 2019-04-06 NOTE — Assessment & Plan Note (Signed)
Stopped controller recently. Sees pulmonology. Never needs albuterol if not active or allergy season. She will continued to not take Breo but advised pulmonology follow-up for prior pneumonia and asthma.

## 2019-04-06 NOTE — Progress Notes (Signed)
Subjective:     Meghan Leon is a 64 y.o. female presenting for Establish Care (no previous PCP. Has GYN Dr Feliberto Gottron) and Follow up pneumonia (would like Chest Xray follow up)     HPI   #Recent pneumonia - no longer feels bad - was prescribed Breo but feels that when she was using this she noted pain in the lower left side - feels that she breaths fine when she is not exerting herself - if exercising she will feel short of breath - was using Advair - for 10-15 years - did feel the advair was helping her breath - switched to [facility] recently - if she keeps up with her allergies she does well - with advair - she never used this regularly - would go weeks w/o using her controller inhaler - has never needed the albuterol inhaler   Review of Systems   Social History   Tobacco Use  Smoking Status Never Smoker  Smokeless Tobacco Never Used        Objective:    BP Readings from Last 3 Encounters:  04/06/19 128/78  03/11/19 (!) 146/98  03/09/19 (!) 186/106   Wt Readings from Last 3 Encounters:  04/06/19 183 lb 8 oz (83.2 kg)  03/11/19 184 lb 6.4 oz (83.6 kg)  03/09/19 180 lb (81.6 kg)    BP 128/78   Pulse 84   Temp 97.8 F (36.6 C)   Resp 12   Ht 5' 3.5" (1.613 m)   Wt 183 lb 8 oz (83.2 kg)   SpO2 97%   BMI 32.00 kg/m    Physical Exam Constitutional:      General: She is not in acute distress.    Appearance: She is well-developed. She is not diaphoretic.  HENT:     Right Ear: External ear normal.     Left Ear: External ear normal.     Nose: Nose normal.  Eyes:     Conjunctiva/sclera: Conjunctivae normal.  Cardiovascular:     Rate and Rhythm: Normal rate and regular rhythm.     Heart sounds: No murmur.  Pulmonary:     Effort: Pulmonary effort is normal. No respiratory distress.     Breath sounds: Normal breath sounds. No wheezing.  Musculoskeletal:     Cervical back: Neck supple.  Skin:    General: Skin is warm and dry.     Capillary  Refill: Capillary refill takes less than 2 seconds.  Neurological:     Mental Status: She is alert. Mental status is at baseline.  Psychiatric:        Mood and Affect: Mood normal.        Behavior: Behavior normal.       The 10-year ASCVD risk score Denman George DC Jr., et al., 2013) is: 9.4%*   Values used to calculate the score:     Age: 85 years     Sex: Female     Is Non-Hispanic African American: Yes     Diabetic: No     Tobacco smoker: No     Systolic Blood Pressure: 128 mmHg     Is BP treated: Yes     HDL Cholesterol: 42 mg/dL*     Total Cholesterol: 211 mg/dL*     * - Cholesterol units were assumed for this score calculation     Assessment & Plan:   Problem List Items Addressed This Visit      Cardiovascular and Mediastinum   Essential hypertension  BP at goal. Cont HCTZ        Respiratory   Asthma - Primary    Stopped controller recently. Sees pulmonology. Never needs albuterol if not active or allergy season. She will continued to not take Breo but advised pulmonology follow-up for prior pneumonia and asthma.         Other   Hyperlipidemia    Previous blood work with ASCVD high risk. Repeat today, pt will work on life style intervention and return in 3-6 months for f/u lipids. Discussed benefit of statin for lowering cholesterol and reducing risk.       Relevant Orders   Lipid panel    Other Visit Diagnoses    Screening for HIV (human immunodeficiency virus)       Relevant Orders   HIV Antibody (routine testing w rflx)   Encounter for hepatitis C screening test for low risk patient       Relevant Orders   Hepatitis C antibody       Return in about 4 months (around 08/04/2019).  Lesleigh Noe, MD

## 2019-04-06 NOTE — Assessment & Plan Note (Signed)
Previous blood work with ASCVD high risk. Repeat today, pt will work on life style intervention and return in 3-6 months for f/u lipids. Discussed benefit of statin for lowering cholesterol and reducing risk.

## 2019-04-06 NOTE — Patient Instructions (Addendum)
Pneumonia - Return to the pulmonologist for follow-up   High cholesterol - We will repeat labs today  Ways to lower cholesterol:   1) Exercise regularly -- 30 minutes, 5 days a week of walking or cardiovascular activity 2) Healthy diet -- can try the dash diet  Plan for a 3-6 month trial of life style change and return for cholesterol check   DASH Eating Plan DASH stands for "Dietary Approaches to Stop Hypertension." The DASH eating plan is a healthy eating plan that has been shown to reduce high blood pressure (hypertension). It may also reduce your risk for type 2 diabetes, heart disease, and stroke. The DASH eating plan may also help with weight loss. What are tips for following this plan?  General guidelines  Avoid eating more than 2,300 mg (milligrams) of salt (sodium) a day. If you have hypertension, you may need to reduce your sodium intake to 1,500 mg a day.  Limit alcohol intake to no more than 1 drink a day for nonpregnant women and 2 drinks a day for men. One drink equals 12 oz of beer, 5 oz of wine, or 1 oz of hard liquor.  Work with your health care provider to maintain a healthy body weight or to lose weight. Ask what an ideal weight is for you.  Get at least 30 minutes of exercise that causes your heart to beat faster (aerobic exercise) most days of the week. Activities may include walking, swimming, or biking.  Work with your health care provider or diet and nutrition specialist (dietitian) to adjust your eating plan to your individual calorie needs. Reading food labels   Check food labels for the amount of sodium per serving. Choose foods with less than 5 percent of the Daily Value of sodium. Generally, foods with less than 300 mg of sodium per serving fit into this eating plan.  To find whole grains, look for the word "whole" as the first word in the ingredient list. Shopping  Buy products labeled as "low-sodium" or "no salt added."  Buy fresh foods. Avoid  canned foods and premade or frozen meals. Cooking  Avoid adding salt when cooking. Use salt-free seasonings or herbs instead of table salt or sea salt. Check with your health care provider or pharmacist before using salt substitutes.  Do not fry foods. Cook foods using healthy methods such as baking, boiling, grilling, and broiling instead.  Cook with heart-healthy oils, such as olive, canola, soybean, or sunflower oil. Meal planning  Eat a balanced diet that includes: ? 5 or more servings of fruits and vegetables each day. At each meal, try to fill half of your plate with fruits and vegetables. ? Up to 6-8 servings of whole grains each day. ? Less than 6 oz of lean meat, poultry, or fish each day. A 3-oz serving of meat is about the same size as a deck of cards. One egg equals 1 oz. ? 2 servings of low-fat dairy each day. ? A serving of nuts, seeds, or beans 5 times each week. ? Heart-healthy fats. Healthy fats called Omega-3 fatty acids are found in foods such as flaxseeds and coldwater fish, like sardines, salmon, and mackerel.  Limit how much you eat of the following: ? Canned or prepackaged foods. ? Food that is high in trans fat, such as fried foods. ? Food that is high in saturated fat, such as fatty meat. ? Sweets, desserts, sugary drinks, and other foods with added sugar. ? Full-fat dairy products.  Do not salt foods before eating.  Try to eat at least 2 vegetarian meals each week.  Eat more home-cooked food and less restaurant, buffet, and fast food.  When eating at a restaurant, ask that your food be prepared with less salt or no salt, if possible. What foods are recommended? The items listed may not be a complete list. Talk with your dietitian about what dietary choices are best for you. Grains Whole-grain or whole-wheat bread. Whole-grain or whole-wheat pasta. Brown rice. [place]. Bulgur. Whole-grain and low-sodium cereals. Pita bread. Low-fat, low-sodium  crackers. Whole-wheat flour tortillas. Vegetables Fresh or frozen vegetables (raw, steamed, roasted, or grilled). Low-sodium or reduced-sodium tomato and vegetable juice. Low-sodium or reduced-sodium tomato sauce and tomato paste. Low-sodium or reduced-sodium canned vegetables. Fruits All fresh, dried, or frozen fruit. Canned fruit in natural juice (without added sugar). Meat and other protein foods Skinless chicken or [place]. Ground chicken or [place]. Pork with fat trimmed off. Fish and seafood. Egg whites. Dried beans, peas, or lentils. Unsalted nuts, nut butters, and seeds. Unsalted canned beans. Lean cuts of beef with fat trimmed off. Low-sodium, lean deli meat. Dairy Low-fat (1%) or fat-free (skim) milk. Fat-free, low-fat, or reduced-fat cheeses. Nonfat, low-sodium ricotta or cottage cheese. Low-fat or nonfat yogurt. Low-fat, low-sodium cheese. Fats and oils Soft margarine without trans fats. Vegetable oil. Low-fat, reduced-fat, or light mayonnaise and salad dressings (reduced-sodium). Canola, safflower, olive, soybean, and sunflower oils. Avocado. Seasoning and other foods Herbs. Spices. Seasoning mixes without salt. Unsalted popcorn and pretzels. Fat-free sweets. What foods are not recommended? The items listed may not be a complete list. Talk with your dietitian about what dietary choices are best for you. Grains Baked goods made with fat, such as croissants, muffins, or some breads. Dry pasta or rice meal packs. Vegetables Creamed or fried vegetables. Vegetables in a cheese sauce. Regular canned vegetables (not low-sodium or reduced-sodium). Regular canned tomato sauce and paste (not low-sodium or reduced-sodium). Regular tomato and vegetable juice (not low-sodium or reduced-sodium). Angie Fava. Olives. Fruits Canned fruit in a light or heavy syrup. Fried fruit. Fruit in cream or butter sauce. Meat and other protein foods Fatty cuts of meat. Ribs. Fried meat. Berniece Salines. Sausage. Bologna and  other processed lunch meats. Salami. Fatback. Hotdogs. Bratwurst. Salted nuts and seeds. Canned beans with added salt. Canned or smoked fish. Whole eggs or egg yolks. Chicken or [place] with skin. Dairy Whole or 2% milk, cream, and half-and-half. Whole or full-fat cream cheese. Whole-fat or sweetened yogurt. Full-fat cheese. Nondairy creamers. Whipped toppings. Processed cheese and cheese spreads. Fats and oils Butter. Stick margarine. Lard. Shortening. Ghee. Bacon fat. Tropical oils, such as coconut, palm kernel, or palm oil. Seasoning and other foods Salted popcorn and pretzels. Onion salt, garlic salt, seasoned salt, table salt, and sea salt. Worcestershire sauce. Tartar sauce. Barbecue sauce. Teriyaki sauce. Soy sauce, including reduced-sodium. Steak sauce. Canned and packaged gravies. Fish sauce. Oyster sauce. Cocktail sauce. Horseradish that you find on the shelf. Ketchup. Mustard. Meat flavorings and tenderizers. Bouillon cubes. Hot sauce and Tabasco sauce. Premade or packaged marinades. Premade or packaged taco seasonings. Relishes. Regular salad dressings. Where to find more information:  National Heart, Lung, and [place]: https://wilson-eaton.com/  American Heart Association: www.heart.org Summary  The DASH eating plan is a healthy eating plan that has been shown to reduce high blood pressure (hypertension). It may also reduce your risk for type 2 diabetes, heart disease, and stroke.  With the DASH eating plan, you should limit salt (sodium)  intake to 2,300 mg a day. If you have hypertension, you may need to reduce your sodium intake to 1,500 mg a day.  When on the DASH eating plan, aim to eat more fresh fruits and vegetables, whole grains, lean proteins, low-fat dairy, and heart-healthy fats.  Work with your health care provider or diet and nutrition specialist (dietitian) to adjust your eating plan to your individual calorie needs. This information is not intended to replace advice  given to you by your health care provider. Make sure you discuss any questions you have with your health care provider. Document Revised: 01/16/2017 Document Reviewed: 01/28/2016 Elsevier Patient Education  2020 [employer].

## 2019-04-11 LAB — HEPATITIS C ANTIBODY
Hepatitis C Ab: REACTIVE — AB
SIGNAL TO CUT-OFF: 2.2 — ABNORMAL HIGH (ref <1.00)

## 2019-04-11 LAB — HIV ANTIBODY (ROUTINE TESTING W REFLEX): HIV 1&2 Ab, 4th Generation: NONREACTIVE

## 2019-04-11 LAB — HCV RNA,QUANTITATIVE REAL TIME PCR
HCV Quantitative Log: <1.18 Log IU/mL
HCV RNA, PCR, QN: <15 IU/mL

## 2019-04-12 ENCOUNTER — Encounter: Payer: Self-pay | Admitting: Family Medicine

## 2019-05-24 ENCOUNTER — Other Ambulatory Visit: Payer: Self-pay

## 2019-05-24 ENCOUNTER — Emergency Department: Payer: 59

## 2019-05-24 ENCOUNTER — Emergency Department
Admission: EM | Admit: 2019-05-24 | Discharge: 2019-05-24 | Disposition: A | Discharge status: Home / Self Care | Payer: 59 | Attending: Emergency Medicine | Admitting: Emergency Medicine

## 2019-05-24 ENCOUNTER — Emergency Department (HOSPITAL_COMMUNITY): Payer: 59

## 2019-05-24 DIAGNOSIS — I10 Essential (primary) hypertension: Secondary | ICD-10-CM | POA: Insufficient documentation

## 2019-05-24 DIAGNOSIS — R0602 Shortness of breath: Secondary | ICD-10-CM | POA: Diagnosis not present

## 2019-05-24 DIAGNOSIS — Z20822 Contact with and (suspected) exposure to covid-19: Secondary | ICD-10-CM | POA: Diagnosis not present

## 2019-05-24 DIAGNOSIS — R911 Solitary pulmonary nodule: Secondary | ICD-10-CM | POA: Insufficient documentation

## 2019-05-24 DIAGNOSIS — J4521 Mild intermittent asthma with (acute) exacerbation: Secondary | ICD-10-CM | POA: Insufficient documentation

## 2019-05-24 DIAGNOSIS — Z79899 Other long term (current) drug therapy: Secondary | ICD-10-CM | POA: Diagnosis not present

## 2019-05-24 LAB — COMPREHENSIVE METABOLIC PANEL
ALT: 17 U/L (ref 0–44)
AST: 19 U/L (ref 15–41)
Albumin: 4 g/dL (ref 3.5–5.0)
Alkaline Phosphatase: 95 U/L (ref 38–126)
Anion gap: 11 (ref 5–15)
BUN: 8 mg/dL (ref 8–23)
CO2: 25 mmol/L (ref 22–32)
Calcium: 9.2 mg/dL (ref 8.9–10.3)
Chloride: 105 mmol/L (ref 98–111)
Creatinine, Ser: 0.81 mg/dL (ref 0.44–1.00)
GFR calc Af Amer: >60 mL/min (ref >60)
GFR calc non Af Amer: >60 mL/min (ref >60)
Glucose, Bld: 115 mg/dL — ABNORMAL HIGH (ref 70–99)
Potassium: 3.6 mmol/L (ref 3.5–5.1)
Sodium: 141 mmol/L (ref 135–145)
Total Bilirubin: 0.5 mg/dL (ref 0.3–1.2)
Total Protein: 7.7 g/dL (ref 6.5–8.1)

## 2019-05-24 LAB — CBC WITH DIFFERENTIAL/PLATELET
Abs Immature Granulocytes: 0.01 10*3/uL (ref 0.00–0.07)
Basophils Absolute: 0.1 10*3/uL (ref 0.0–0.1)
Basophils Relative: 1 %
Eosinophils Absolute: 0.6 10*3/uL — ABNORMAL HIGH (ref 0.0–0.5)
Eosinophils Relative: 7 %
HCT: 43 % (ref 36.0–46.0)
Hemoglobin: 13.6 g/dL (ref 12.0–15.0)
Immature Granulocytes: 0 %
Lymphocytes Relative: 24 %
Lymphs Abs: 2.2 10*3/uL (ref 0.7–4.0)
MCH: 25 pg — ABNORMAL LOW (ref 26.0–34.0)
MCHC: 31.6 g/dL (ref 30.0–36.0)
MCV: 79.2 fL — ABNORMAL LOW (ref 80.0–100.0)
Monocytes Absolute: 0.7 10*3/uL (ref 0.1–1.0)
Monocytes Relative: 8 %
Neutro Abs: 5.4 10*3/uL (ref 1.7–7.7)
Neutrophils Relative %: 60 %
Platelets: 295 10*3/uL (ref 150–400)
RBC: 5.43 MIL/uL — ABNORMAL HIGH (ref 3.87–5.11)
RDW: 14.9 % (ref 11.5–15.5)
WBC: 8.9 10*3/uL (ref 4.0–10.5)
nRBC: 0 % (ref 0.0–0.2)

## 2019-05-24 LAB — TROPONIN I (HIGH SENSITIVITY)
Troponin I (High Sensitivity): 5 ng/L (ref <18)
Troponin I (High Sensitivity): 4 ng/L (ref <18)

## 2019-05-24 LAB — SARS CORONAVIRUS 2 (TAT 6-24 HRS): SARS Coronavirus 2: NEGATIVE

## 2019-05-24 MED ORDER — IPRATROPIUM-ALBUTEROL 0.5-2.5 (3) MG/3ML IN SOLN
3.0000 mL | Freq: Once | RESPIRATORY_TRACT | Status: AC
  Administered 2019-05-24: 3 mL via RESPIRATORY_TRACT
  Filled 2019-05-24: qty 3

## 2019-05-24 MED ORDER — AMLODIPINE BESYLATE 5 MG PO TABS: 5.0000 mg | ORAL_TABLET | Freq: Every day | ORAL | 0 refills | Status: DC

## 2019-05-24 MED ORDER — PREDNISONE 20 MG PO TABS: ORAL_TABLET | ORAL | 0 refills | Status: DC

## 2019-05-24 MED ORDER — COMPRESSOR/NEBULIZER MISC: 1.0000 [IU] | 0 refills | Status: DC | PRN

## 2019-05-24 MED ORDER — ALBUTEROL SULFATE (2.5 MG/3ML) 0.083% IN NEBU: 2.5000 mg | INHALATION_SOLUTION | RESPIRATORY_TRACT | 0 refills | Status: DC | PRN

## 2019-05-24 MED ORDER — METHYLPREDNISOLONE SODIUM SUCC 125 MG IJ SOLR
125.0000 mg | Freq: Once | INTRAMUSCULAR | Status: AC
  Administered 2019-05-24: 125 mg via INTRAVENOUS

## 2019-05-24 NOTE — Discharge Instructions (Addendum)
1.  Start Amlodipine 5 mg daily for blood pressure. 2.  Finish steroid as prescribed (Prednisone 60 mg daily x4 days). 3.  You may use albuterol inhaler or nebulizer every 4 hours as needed for wheezing/difficulty breathing. 4.  Return to the ER for worsening symptoms, persistent vomiting, difficulty breathing or other concerns.

## 2019-05-24 NOTE — ED Provider Notes (Signed)
[facility] Emergency Department Provider Note   ____________________________________________   First MD Initiated Contact with Patient 05/24/19 [phone]     (approximate)  I have reviewed the triage vital signs and the nursing notes.   HISTORY  Chief Complaint Shortness of Breath    HPI MERSEDES Leon is a 64 y.o. female who presents to the ED from home with a chief complaint of shortness of breath, chest tightness and racing heart.  Patient has a history of asthma, never hospitalized, who states she has been having trouble with sinuses/allergies this week.  Using albuterol MDI without relief of symptoms.  Reports dry cough, wheezing, chest tightness.  Denies fever, abdominal pain, nausea, vomiting or diarrhea.       Past Medical History:  Diagnosis Date  . Allergy 15 years ago   chemical exposure  . Asthma   . Hypertension January 2021   ED visit  . Medical history non-contributory     Patient Active Problem List   Diagnosis Date Noted  . Essential hypertension 04/06/2019  . Asthma 04/06/2019  . Hyperlipidemia 04/06/2019    Past Surgical History:  Procedure Laterality Date  . ABDOMINAL HYSTERECTOMY  Age 51  . CESAREAN SECTION    . COLONOSCOPY WITH PROPOFOL N/A 04/13/2015   Procedure: COLONOSCOPY WITH PROPOFOL;  Surgeon: Lollie Sails, MD;  [facility] ENDOSCOPY;  Service: Endoscopy;  Laterality: N/A;    Prior to Admission medications   Medication Sig Start Date End Date Taking? Authorizing Provider  albuterol (VENTOLIN HFA) 108 (90 Base) MCG/ACT inhaler Inhale 2 puffs into the lungs every 6 (six) hours as needed for wheezing or shortness of breath. 03/11/19  Yes Tyler Pita, MD  hydrochlorothiazide (HYDRODIURIL) 25 MG tablet Take 1 tablet (25 mg total) by mouth daily. 03/09/19  Yes Harvest Dark, MD  albuterol (PROVENTIL) (2.5 MG/3ML) 0.083% nebulizer solution Take 3 mLs (2.5 mg total) by nebulization every 4 (four) hours  as needed for wheezing or shortness of breath. 05/24/19   Paulette Blanch, MD  amLODipine (NORVASC) 5 MG tablet Take 1 tablet (5 mg total) by mouth daily. 05/24/19   Paulette Blanch, MD  Nebulizers (COMPRESSOR/NEBULIZER) MISC 1 Units by Does not apply route every 4 (four) hours as needed. 05/24/19   Paulette Blanch, MD  predniSONE (DELTASONE) 20 MG tablet 3 tablets daily x 4 days 05/24/19   Paulette Blanch, MD    Allergies Patient has no known allergies.  Family History  Problem Relation Age of Onset  . Breast cancer Daughter 30  . Alzheimer's disease Mother   . Depression Mother   . Hypertension Mother   . Alcohol abuse Father   . Diabetes Father   . Liver cancer Father   . Diabetes Brother   . Alcohol abuse Brother   . Diabetes Sister   . Alcohol abuse Sister   . Arthritis Sister   . COPD Sister   . Depression Sister   . Drug abuse Sister   . Hypertension Sister   . Hyperlipidemia Sister   . Alcohol abuse Sister   . Hyperlipidemia Sister   . Hypertension Sister   . Diabetes Sister   . Hypertension Sister   . Hypertension Daughter     Social History Social History   Tobacco Use  . Smoking status: Never Smoker  . Smokeless tobacco: Never Used  Substance Use Topics  . Alcohol use: No  . Drug use: No    Review of Systems  Constitutional: No fever/chills Eyes: No visual changes. ENT: No sore throat. Cardiovascular: Positive for chest pain. Respiratory: Positive for dry cough, wheezing and shortness of breath. Gastrointestinal: No abdominal pain.  No nausea, no vomiting.  No diarrhea.  No constipation. Genitourinary: Negative for dysuria. Musculoskeletal: Negative for back pain. Skin: Negative for rash. Neurological: Negative for headaches, focal weakness or numbness.   ____________________________________________   PHYSICAL EXAM:  VITAL SIGNS: ED Triage Vitals [05/24/19 0212]  Enc Vitals Group     BP (!) 190/105     Pulse Rate (!) 110     Resp (!) 26     Temp 97.7 F  (36.5 C)     Temp Source Oral     SpO2 91 %     Weight 180 lb (81.6 kg)     Height 5' 4.5" (1.638 m)     Head Circumference      Peak Flow      Pain Score 2     Pain Loc      Pain Edu?      Excl. in [place]?     Constitutional: Alert and oriented. Well appearing and in mild acute distress. Eyes: Conjunctivae are normal. PERRL. EOMI. Head: Atraumatic. Nose: No congestion/rhinnorhea. Mouth/Throat: Mucous membranes are moist.  Neck: No stridor.   Cardiovascular: Tachycardic rate, regular rhythm. Grossly normal heart sounds.  Good peripheral circulation. Respiratory: Increased respiratory effort.  No retractions. Lungs with audible wheezing. Gastrointestinal: Soft and nontender. No distention. No abdominal bruits. No CVA tenderness. Musculoskeletal: No lower extremity tenderness nor edema.  No joint effusions. Neurologic:  Normal speech and language. No gross focal neurologic deficits are appreciated.  Skin:  Skin is warm, dry and intact. No rash noted. Psychiatric: Mood and affect are normal. Speech and behavior are normal.  ____________________________________________   LABS (all labs ordered are listed, but only abnormal results are displayed)  Labs Reviewed  CBC WITH DIFFERENTIAL/PLATELET - Abnormal; Notable for the following components:      Result Value   RBC 5.43 (*)    MCV 79.2 (*)    MCH 25.0 (*)    Eosinophils Absolute 0.6 (*)    All other components within normal limits  COMPREHENSIVE METABOLIC PANEL - Abnormal; Notable for the following components:   Glucose, Bld 115 (*)    All other components within normal limits  SARS CORONAVIRUS 2 (TAT 6-24 HRS)  TROPONIN I (HIGH SENSITIVITY)  TROPONIN I (HIGH SENSITIVITY)   ____________________________________________  EKG  ED ECG REPORT I, Jahking Lesser J, the attending physician, personally viewed and interpreted this ECG.   Date: 05/24/2019  EKG Time: 0220  Rate: 101  Rhythm: sinus tachycardia  Axis: Normal   Intervals:none  ST&T Change: Nonspecific  ____________________________________________  RADIOLOGY  ED MD interpretation: Chest x-ray demonstrates ill-defined nodular opacities, recommend CT chest; CT chest demonstrates extensive upper lobe prominent nodularity could be atypical infection but sarcoidosis more likely  Official radiology report(s): CT Chest Wo Contrast  Result Date: 05/24/2019 CLINICAL DATA:  Shortness of breath. Abnormal chest x-ray. EXAM: CT CHEST WITHOUT CONTRAST TECHNIQUE: Multidetector CT imaging of the chest was performed following the standard protocol without IV contrast. COMPARISON:  Chest x-ray, same date. FINDINGS: Cardiovascular: The heart is normal in size. No pericardial effusion. The aorta is normal in caliber. No atherosclerotic calcifications. No definite coronary artery calcifications. Mediastinum/Nodes: Extensive mediastinal and hilar adenopathy most of which demonstrate some areas of calcification. Findings could be due to remote granulomatous disease or sarcoidosis. The esophagus is grossly  normal. Lungs/Pleura: Extensive upper lobe predominant nodularity along the bronchovascular bundles with more focal airspace consolidation noted in the right upper lobe. Similar findings throughout both lungs are noted. There is also some branching appearing tree-in-bud type changes. The process was definitely present on prior chest film from January. Findings could be due to atypical mycobacterial infection such as MAC or possibly even TB. However, sarcoidosis is also possible with areas of alveolar sarcoid. Recommend pulmonary consultation. No worrisome pulmonary lesions or pleural effusion. No bronchiectasis. The central tracheobronchial tree is unremarkable. Upper Abdomen: No significant upper abdominal findings. No adenopathy or calcified granulomas. Musculoskeletal: No breast masses, supraclavicular or axillary adenopathy. The thyroid gland appears normal. IMPRESSION: 1.  Extensive upper lobe predominant nodularity along the bronchovascular bundles with some branching appearing tree-in-bud type changes also. Findings could be due to atypical mycobacterial infection such as MAC. However, given the extensive adenopathy, sarcoidosis is more likely. Recommend pulmonary consultation. 2. Extensive calcified mediastinal and hilar adenopathy. 3. No worrisome pulmonary lesions or pleural effusion. Electronically Signed   By: Marijo Sanes M.D.   On: 05/24/2019 06:00   DG Chest Port 1 View  Result Date: 05/24/2019 CLINICAL DATA:  Shortness of breath. EXAM: PORTABLE CHEST 1 VIEW COMPARISON:  March 11, 2019 FINDINGS: Small ill-defined nodular opacities are again seen within the right upper lobe, predominant stable in appearance when compared to the prior exam. There is no evidence of a pleural effusion or pneumothorax. The heart size and mediastinal contours are within normal limits. The visualized skeletal structures are unremarkable. IMPRESSION: Stable ill-defined nodular opacities within the right upper lobe. Correlation with chest CT is recommended. Electronically Signed   By: Virgina Norfolk M.D.   On: 05/24/2019 03:11    ____________________________________________   PROCEDURES  Procedure(s) performed (including Critical Care):  .1-3 Lead EKG Interpretation Performed by: Paulette Blanch, MD Authorized by: Paulette Blanch, MD     Interpretation: normal     ECG rate:  98   ECG rate assessment: normal     Rhythm: sinus rhythm     Ectopy: none     Conduction: normal       ____________________________________________   INITIAL IMPRESSION / ASSESSMENT AND PLAN / ED COURSE  As part of my medical decision making, I reviewed the following data within the [place] notes reviewed and incorporated, Labs reviewed, EKG interpreted, Old chart reviewed, Radiograph reviewed and Notes from prior ED visits     NISA DECAIRE was evaluated in  Emergency Department on 05/24/2019 for the symptoms described in the history of present illness. She was evaluated in the context of the global COVID-19 pandemic, which necessitated consideration that the patient might be at risk for infection with the SARS-CoV-2 virus that causes COVID-19. Institutional protocols and algorithms that pertain to the evaluation of patients at risk for COVID-19 are in a state of rapid change based on information released by regulatory bodies including the CDC and federal and state organizations. These policies and algorithms were followed during the patient's care in the ED.    64 year old female with asthma who presents with cough, wheezing and shortness of breath. Differential includes, but is not limited to, viral syndrome, bronchitis including COPD exacerbation, pneumonia, reactive airway disease including asthma, CHF including exacerbation with or without pulmonary/interstitial edema, pneumothorax, ACS, thoracic trauma, and pulmonary embolism.  Will obtain basic lab work, chest x-ray, COVID-19 swab.  Administer IV Solu-Medrol, DuoNeb and reassess.   Clinical Course as of Apr  [place] May 24, 2019  0416 Wheezing improved.  Will repeat troponin.  Patient tells me she stopped her HCTZ due to side effects and is currently not on any antihypertensive medications.   [JS]  [id number] Updated patient on CT imaging result.  Patient saw Dr. Patsey Berthold from pulmonology several weeks ago and treated for lung infection.  Has an upcoming appointment in 3 weeks.  Will follow up with her for further evaluation of possible sarcoidosis.  She will finish Prednisone burst and start Norvasc for hypertension.  Strict return precautions given.  Patient verbalizes understanding agrees with plan of care.   [JS]    Clinical Course User Index [JS] Paulette Blanch, MD     ____________________________________________   FINAL CLINICAL IMPRESSION(S) / ED DIAGNOSES  Final diagnoses:  Shortness  of breath  Mild intermittent asthma with exacerbation  Essential hypertension  Pulmonary nodule     ED Discharge Orders         Ordered    amLODipine (NORVASC) 5 MG tablet  Daily     05/24/19 0558    predniSONE (DELTASONE) 20 MG tablet     05/24/19 0558    albuterol (PROVENTIL) (2.5 MG/3ML) 0.083% nebulizer solution  Every 4 hours PRN     05/24/19 0558    Nebulizers (COMPRESSOR/NEBULIZER) MISC  Every 4 hours PRN     05/24/19 0558           Note:  This document was prepared using Dragon voice recognition software and may include unintentional dictation errors.   Paulette Blanch, MD 05/24/19 [phone]

## 2019-05-24 NOTE — ED Notes (Signed)
Pt transported to CT ?

## 2019-05-24 NOTE — ED Triage Notes (Signed)
Pt to the er for sob, chest heaviness, and racing heart. Pt denies any cardiac hx.

## 2019-05-26 ENCOUNTER — Ambulatory Visit: Payer: 59 | Admitting: Pulmonary Disease

## 2019-05-27 ENCOUNTER — Encounter: Payer: Self-pay | Admitting: Pulmonary Disease

## 2019-05-27 ENCOUNTER — Other Ambulatory Visit: Payer: Self-pay

## 2019-05-27 ENCOUNTER — Ambulatory Visit: Payer: 59 | Admitting: Pulmonary Disease

## 2019-05-27 ENCOUNTER — Other Ambulatory Visit: Payer: Self-pay | Admitting: Pulmonary Disease

## 2019-05-27 VITALS — BP 160/100 | HR 86 | Ht 63.5 in | Wt 177.6 lb

## 2019-05-27 DIAGNOSIS — R0602 Shortness of breath: Secondary | ICD-10-CM

## 2019-05-27 DIAGNOSIS — R918 Other nonspecific abnormal finding of lung field: Secondary | ICD-10-CM

## 2019-05-27 DIAGNOSIS — I10 Essential (primary) hypertension: Secondary | ICD-10-CM | POA: Diagnosis not present

## 2019-05-27 DIAGNOSIS — J45901 Unspecified asthma with (acute) exacerbation: Secondary | ICD-10-CM | POA: Diagnosis not present

## 2019-05-27 MED ORDER — OMEPRAZOLE 40 MG PO CPDR: 40.0000 mg | DELAYED_RELEASE_CAPSULE | Freq: Every day | ORAL | 2 refills | Status: DC

## 2019-05-27 MED ORDER — PREDNISONE 20 MG PO TABS: ORAL_TABLET | ORAL | 0 refills | Status: DC

## 2019-05-27 MED ORDER — ADVAIR HFA 115-21 MCG/ACT IN AERO: 2.0000 | INHALATION_SPRAY | Freq: Two times a day (BID) | RESPIRATORY_TRACT | 12 refills | Status: DC

## 2019-05-27 MED ORDER — DOXYCYCLINE HYCLATE 100 MG PO TABS: 100.0000 mg | ORAL_TABLET | Freq: Two times a day (BID) | ORAL | 0 refills | Status: DC

## 2019-05-27 NOTE — Patient Instructions (Addendum)
I am going to give you extra doses of the prednisone  We will start medication for reflux (omeprazole), reflux may be the cause for your cough after meals, reflux can also make asthma worse  I am giving you an antibiotic called doxycycline please make sure that you take this twice a day with meals  We have switchedyour Breo back to Advair HFA since you have issues with the powdered preparation  We will see you in follow-up in 3 to 4 weeks

## 2019-05-27 NOTE — Progress Notes (Signed)
Subjective:    Patient ID: Meghan Leon, female    DOB: 03-17-1955, 64 y.o.   MRN: [id number]  HPI Patient is a 64 year old with a history of asthma whom we first evaluated here on 11 March 2019 as a self-referral.  She has been switched to Kellogg 1 inhalation daily.  She has environmental allergy triggers.  She presented to the emergency room on 6 April with increased shortness of breath, chest tightness and racing heart.  She had a week long history prior to that visit of sinus pressure and perhaps a URI.  She had reported a sick contact (grandson) she has been using her inhaler and her metered-dose inhaler of albuterol without relief.  She was evaluated in the emergency room was noted to have significantly elevated blood pressure at 190/105 and tachycardia at 110 bpm.  She had chest x-ray that showed nodular infiltrate on the right upper lobe which were stable from prior.  Recall she had had a chest x-ray during her prior visit and was treated empirically with antibiotics.  She had a chest CT which showed predominant nodularity along the bronchovascular bundles in the upper lobes right greater than left with some tree-in-bud type changes.  On independent review findings are consistent with possible sarcoidosis this is ABPA given the patient's history of asthma.  Patient is currently on prednisone and feels somewhat better but still having some symptoms of dyspnea and wheezing.  She has been using Kellogg but feels that this makes her cough more due to the powdered delivery.  Noting cough particularly after meals and has had some issues with reflux.  She had discontinued taking her blood pressure medication (HCTZ) due to abdominal pain with the medication.  She did not notify her primary care physician.  She was started on amlodipine in the emergency room.  She is to have a visit with her primary care physician soon.   Review of Systems A 10 point review of systems was performed and  it is as noted above otherwise negative.    Objective:   Physical Exam BP (!) 160/100 (BP Location: Left Arm, Cuff Size: Normal)   Pulse 86   Ht 5' 3.5" (1.613 m)   Wt 177 lb 9.6 oz (80.6 kg)   SpO2 96%   BMI 30.97 kg/m  GENERAL: Awake, alert, fully ambulatory.  No distress.  Well-developed well-nourished. HEAD: Normocephalic, atraumatic.  EYES: Pupils equal, round, reactive to light.  No scleral icterus.  MOUTH: Nose/mouth/throat not examined due to masking requirements for COVID 19.   NECK: Supple. No thyromegaly. No nodules. No JVD.  Trachea midline.  No crepitus. PULMONARY:  Good air entry bilaterally, faint wheezes particularly in the mid lung fields bilaterally with no other adventitious sounds. CARDIOVASCULAR: S1 and S2. Regular rate and rhythm.  No rubs or gallops heard. GASTROINTESTINAL: No distention. MUSCULOSKELETAL: No joint deformity, no clubbing, no edema.  NEUROLOGIC: Awake, alert, no focal deficits.  Fluent speech. SKIN: Intact,warm,dry.  No rashes noted. PSYCH: Mood and behavior appropriate.  CT scan performed 24 May 2019 reviewed and representative slice as noted below:     Assessment & Plan:   Persistent asthma with acute exacerbation, unspecified asthma severity Shortness of breath Patient continues to have issues with prolonged exacerbation Continue prednisone 20 mg daily x1 more week then decrease to 10 mg daily x1 more week then DC Switch to Advair HFA 115/21, 2 inhalations twice a day, patient not tolerated powdered preparation and Breo Continue rescue albuterol  as needed Add doxycycline 100 mg twice daily x10 days She will need PFTs once her exacerbation has resolved Will need ABPA/sarcoid/allergy evaluation once prednisone tapered off  Abnormal lung imaging DDx: Sarcoidosis, ABPA, MAC Formal work-up once steroids tapered off May need bronchoscopy  Essential hypertension This issue adds complexity to her management  Follow-up will be in 3 to 4  weeks.  Sooner should any new difficulties arise.   Renold Don, MD [facility]  *This note was dictated using voice recognition software/Dragon.  Despite best efforts to proofread, errors can occur which can change the meaning.  Any change was purely unintentional.

## 2019-05-27 NOTE — Telephone Encounter (Signed)
Prescription sent to pharmacy. Nothing further needed at this time. 

## 2019-06-08 ENCOUNTER — Ambulatory Visit: Payer: 59 | Admitting: Pulmonary Disease

## 2019-06-10 ENCOUNTER — Ambulatory Visit: Payer: 59 | Admitting: Pulmonary Disease

## 2019-06-10 ENCOUNTER — Encounter: Payer: Self-pay | Admitting: Family Medicine

## 2019-06-10 MED ORDER — AMLODIPINE BESYLATE 5 MG PO TABS: 5.0000 mg | ORAL_TABLET | Freq: Every day | ORAL | 0 refills | Status: DC

## 2019-06-10 NOTE — Telephone Encounter (Signed)
I pulled medication down for review to refill. Change of medication was done in the ER. Does patient need to follow up with you from this visit?

## 2019-06-21 ENCOUNTER — Other Ambulatory Visit: Payer: Self-pay | Admitting: Family Medicine

## 2019-06-21 MED ORDER — AMLODIPINE BESYLATE 5 MG PO TABS: 5.0000 mg | ORAL_TABLET | Freq: Every day | ORAL | 1 refills | Status: DC

## 2019-06-21 NOTE — Addendum Note (Signed)
Addended by: Consuella Lose on: 06/21/2019 12:34 PM   Modules accepted: Orders

## 2019-06-29 ENCOUNTER — Ambulatory Visit: Payer: 59 | Admitting: Pulmonary Disease

## 2019-06-29 ENCOUNTER — Encounter: Payer: Self-pay | Admitting: Pulmonary Disease

## 2019-06-29 ENCOUNTER — Other Ambulatory Visit: Payer: Self-pay

## 2019-06-29 VITALS — BP 128/84 | HR 77 | Temp 97.7°F | Ht 63.5 in | Wt 182.0 lb

## 2019-06-29 DIAGNOSIS — R918 Other nonspecific abnormal finding of lung field: Secondary | ICD-10-CM | POA: Diagnosis not present

## 2019-06-29 DIAGNOSIS — J45909 Unspecified asthma, uncomplicated: Secondary | ICD-10-CM

## 2019-06-29 DIAGNOSIS — D869 Sarcoidosis, unspecified: Secondary | ICD-10-CM | POA: Diagnosis not present

## 2019-06-29 NOTE — Progress Notes (Signed)
   Subjective:    Patient ID: Meghan Leon, female    DOB: 1955-12-25, 63 y.o.   MRN: [id number]  HPI Is a 64 year old with a history of asthma first evaluated here on 11 March 2019 as a self-referral.  She is currently on Advair 115/21, as maintenance.  She is only taking 1 inhalation twice a day.  Recall that she had been on the ED on 6 April with increased shortness of breath.  At that time CT scan of the chest showed findings consistent with possible sarcoidosis versus atypical mycobacterial infection.  Treated with prednisone and doxycycline and since then has done markedly better.  She notes that she is doing well and totally asymptomatic today.  No fevers, chills or sweats.  Doing well as noted above.  Will need to follow-up on her abnormal imaging of the chest on 6 April, please refer to the prior note for details.  She may need bronchoscopy for further diagnostic work-up.  Patient reports good appetite.  No weight loss.  Dyspnea.  No palpitations, chest pain, orthopnea or paroxysmal nocturnal dyspnea.  No lower extremity edema.  She feels well and looks well.  Review of Systems A 10 point review of systems was performed and it is as noted above otherwise negative.  Immunization History  Administered Date(s) Administered  . Influenza-Unspecified 12/08/2018  . PFIZER SARS-COV-2 Vaccination 03/11/2019, 04/01/2019  . Tdap 06/03/2005       Objective:   Physical Exam BP 128/84 (BP Location: Left Arm, Cuff Size: Normal)   Pulse 77   Temp 97.7 F (36.5 C) (Temporal)   Ht 5' 3.5" (1.613 m)   Wt 182 lb (82.6 kg)   SpO2 99%   BMI 31.73 kg/m   GENERAL: Awake, alert, fully ambulatory. No distress. Well-developed well-nourished. HEAD: Normocephalic, atraumatic.  EYES: Pupils equal, round, reactive to light. No scleral icterus.  MOUTH: Nose/mouth/throat not examined due to masking requirements for COVID 19.  NECK: Supple. No thyromegaly. No JVD. Trachea midline. No  crepitus. PULMONARY: Good air entry bilaterally,no adventitious sounds. CARDIOVASCULAR: S1 and S2. Regular rate and rhythm. No rubs or gallops heard. GASTROINTESTINAL: No distention. MUSCULOSKELETAL: No joint deformity, no clubbing, no edema.  NEUROLOGIC: Awake, alert, no focal deficits. Fluent speech. SKIN: Intact,warm,dry.No rashes noted. PSYCH: Mood and behavior appropriate.      Assessment & Plan:   Persistent asthma, unspecified asthma severity Shortness of breath -RESOLVED Continue Advair HFA 115/21 I recommend she do the prescribed dose of 2 puffs twice a day Continue rescue albuterol as needed Obtain PFTs Further work-up of potential allergic component upon return visit Follow-up in 4 to 6 weeks time she is to contact us prior to that time should any new difficulties arise  Abnormal lung imaging Sarcoid suspected DDx: Sarcoidosis, ABPA, MAC Repeat CT scan 4 to 6 weeks in time for follow-up visit May need bronchoscopy This was discussed with the patient   C. Derrill Kay, MD [facility]   *This note was dictated using voice recognition software/Dragon.  Despite best efforts to proofread, errors can occur which can change the meaning.  Any change was purely unintentional.

## 2019-06-29 NOTE — Patient Instructions (Addendum)
Continue medications as you are doing.  We are going to see you in follow-up in 4 to 6 weeks with a CT scan of the chest at that time.  We are also going to set up breathing tests.  I believe that your issue is that you have some underlying sarcoidosis which is an inflammatory disease asthma can sometimes be seen in patients with sarcoid.

## 2019-07-01 ENCOUNTER — Ambulatory Visit: Payer: 59 | Admitting: Internal Medicine

## 2019-07-01 ENCOUNTER — Encounter: Payer: Self-pay | Admitting: Internal Medicine

## 2019-07-01 ENCOUNTER — Other Ambulatory Visit: Payer: Self-pay

## 2019-07-01 ENCOUNTER — Encounter: Payer: Self-pay | Admitting: Family Medicine

## 2019-07-01 DIAGNOSIS — L568 Other specified acute skin changes due to ultraviolet radiation: Secondary | ICD-10-CM | POA: Diagnosis not present

## 2019-07-01 MED ORDER — TRIAMCINOLONE ACETONIDE 0.1 % EX CREA: 1.0000 "application " | TOPICAL_CREAM | Freq: Three times a day (TID) | CUTANEOUS | 1 refills | Status: DC | PRN

## 2019-07-01 MED ORDER — PREDNISONE 20 MG PO TABS: 40.0000 mg | ORAL_TABLET | Freq: Every day | ORAL | 0 refills | Status: DC

## 2019-07-01 NOTE — Progress Notes (Signed)
Subjective:    Patient ID: Meghan Leon, female    DOB: [birth year], 64 y.o.   MRN: [id number]  HPI Here due to skin redness This visit occurred during the SARS-CoV-2 public health emergency.  Safety protocols were in place, including screening questions prior to the visit, additional usage of staff PPE, and extensive cleaning of exam room while observing appropriate contact time as indicated for disinfecting solutions.   Being treated for lung infection--finished doxycycline 3 weeks ago (and prednisone) That is getting some better Scheduled for CT scan again next month (through Dr Patsey Berthold)  1 week ago--- was out in the sun for 4 hours Had rolled up her pant legs---and then noticed redness on anterior calves Redness noted that evening Has some pain and tingling sensation  Also has some swelling  Current Outpatient Medications on File Prior to Visit  Medication Sig Dispense Refill  . albuterol (PROVENTIL) (2.5 MG/3ML) 0.083% nebulizer solution Take 3 mLs (2.5 mg total) by nebulization every 4 (four) hours as needed for wheezing or shortness of breath. 75 mL 0  . albuterol (VENTOLIN HFA) 108 (90 Base) MCG/ACT inhaler Inhale 2 puffs into the lungs every 6 (six) hours as needed for wheezing or shortness of breath. 18 g 3  . amLODipine (NORVASC) 5 MG tablet Take 1 tablet (5 mg total) by mouth daily. 90 tablet 1  . fluticasone-salmeterol (ADVAIR HFA) 115-21 MCG/ACT inhaler Inhale 2 puffs into the lungs 2 (two) times daily. 1 Inhaler 12  . Nebulizers (COMPRESSOR/NEBULIZER) MISC 1 Units by Does not apply route every 4 (four) hours as needed. 1 each 0   No current facility-administered medications on file prior to visit.    No Known Allergies  Past Medical History:  Diagnosis Date  . Allergy 15 years ago   chemical exposure  . Asthma   . Hypertension January 2021   ED visit  . Medical history non-contributory     Past Surgical History:  Procedure Laterality Date  . ABDOMINAL  HYSTERECTOMY  Age 50  . CESAREAN SECTION    . COLONOSCOPY WITH PROPOFOL N/A 04/13/2015   Procedure: COLONOSCOPY WITH PROPOFOL;  Surgeon: Lollie Sails, MD;  Location: [facility] ENDOSCOPY;  Service: Endoscopy;  Laterality: N/A;    Family History  Problem Relation Age of Onset  . Breast cancer Daughter 58  . Alzheimer's disease Mother   . Depression Mother   . Hypertension Mother   . Alcohol abuse Father   . Diabetes Father   . Liver cancer Father   . Diabetes Brother   . Alcohol abuse Brother   . Diabetes Sister   . Alcohol abuse Sister   . Arthritis Sister   . COPD Sister   . Depression Sister   . Drug abuse Sister   . Hypertension Sister   . Hyperlipidemia Sister   . Alcohol abuse Sister   . Hyperlipidemia Sister   . Hypertension Sister   . Diabetes Sister   . Hypertension Sister   . Hypertension Daughter     Social History   Socioeconomic History  . Marital status: Married    Spouse name: Ray  . Number of children: 2  . Years of education: college  . Highest education level: Not on file  Occupational History  . Not on file  Tobacco Use  . Smoking status: Never Smoker  . Smokeless tobacco: Never Used  Substance and Sexual Activity  . Alcohol use: No  . Drug use: No  . Sexual activity: Yes  Birth control/protection: Surgical  Other Topics Concern  . 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 Difficulty of Paying Living Expenses:   Food Insecurity:   . Worried  About Programme researcher, broadcasting/film/video in the Last Year:   . Barista in the Last Year:   Transportation Needs:   . Freight forwarder (Medical):   Marland Kitchen Lack of Transportation (Non-Medical):   Physical Activity:   . Days of Exercise per Week:   . Minutes of Exercise per Session:   Stress:   . Feeling of Stress :   Social Connections:   . Frequency of Communication with Friends and Family:   . Frequency of Social Gatherings with Friends and Family:   . Attends Religious Services:   . Active Member of Clubs or Organizations:   . Attends Banker Meetings:   Marland Kitchen Marital Status:   Intimate Partner Violence:   . Fear of Current or Ex-Partner:   . Emotionally Abused:   Marland Kitchen Physically Abused:   . Sexually Abused:    Review of Systems No fever No ulcerations in calves    Objective:   Physical Exam  Musculoskeletal:     Comments: 1+ pitting edema in ankles and calves No calf tenderness  Skin:  Confluent redness over anterior calves---larger on left than right           Assessment & Plan:

## 2019-07-01 NOTE — Assessment & Plan Note (Signed)
Has a fairly classic photosensitivity ---and she was on doxycycline, but finished it 2 weeks before the rash Has some bothersome symptoms--but not severe Will try triamcinolone cream for symptom relief--Rx for prednisone if that isn't enough

## 2019-07-15 ENCOUNTER — Other Ambulatory Visit
Admission: RE | Admit: 2019-07-15 | Discharge: 2019-07-15 | Disposition: A | Discharge status: Home / Self Care | Payer: 59 | Source: Ambulatory Visit | Attending: Pulmonary Disease | Admitting: Pulmonary Disease

## 2019-07-15 ENCOUNTER — Other Ambulatory Visit: Payer: Self-pay

## 2019-07-15 DIAGNOSIS — Z01812 Encounter for preprocedural laboratory examination: Secondary | ICD-10-CM | POA: Diagnosis not present

## 2019-07-15 DIAGNOSIS — Z20822 Contact with and (suspected) exposure to covid-19: Secondary | ICD-10-CM | POA: Diagnosis not present

## 2019-07-16 LAB — SARS CORONAVIRUS 2 (TAT 6-24 HRS): SARS Coronavirus 2: NEGATIVE

## 2019-07-19 ENCOUNTER — Other Ambulatory Visit: Payer: Self-pay

## 2019-07-19 ENCOUNTER — Ambulatory Visit
Admission: RE | Admit: 2019-07-19 | Discharge: 2019-07-19 | Disposition: A | Discharge status: Home / Self Care | Payer: 59 | Source: Ambulatory Visit | Attending: Pulmonary Disease | Admitting: Pulmonary Disease

## 2019-07-19 ENCOUNTER — Ambulatory Visit (HOSPITAL_COMMUNITY): Payer: 59

## 2019-07-19 DIAGNOSIS — J45909 Unspecified asthma, uncomplicated: Secondary | ICD-10-CM | POA: Insufficient documentation

## 2019-07-19 DIAGNOSIS — D869 Sarcoidosis, unspecified: Secondary | ICD-10-CM | POA: Insufficient documentation

## 2019-07-19 DIAGNOSIS — R918 Other nonspecific abnormal finding of lung field: Secondary | ICD-10-CM | POA: Insufficient documentation

## 2019-07-19 MED ORDER — ALBUTEROL SULFATE (2.5 MG/3ML) 0.083% IN NEBU
2.5000 mg | INHALATION_SOLUTION | Freq: Once | RESPIRATORY_TRACT | Status: AC
  Administered 2019-07-19: 2.5 mg via RESPIRATORY_TRACT
  Filled 2019-07-19: qty 3

## 2019-07-27 ENCOUNTER — Other Ambulatory Visit: Payer: Self-pay

## 2019-07-27 DIAGNOSIS — D869 Sarcoidosis, unspecified: Secondary | ICD-10-CM

## 2019-08-04 ENCOUNTER — Ambulatory Visit: Payer: 59 | Admitting: Family Medicine

## 2019-08-04 ENCOUNTER — Other Ambulatory Visit: Payer: Self-pay

## 2019-08-04 ENCOUNTER — Encounter: Payer: Self-pay | Admitting: Family Medicine

## 2019-08-04 VITALS — BP 122/82 | Temp 98.6°F | Ht 63.5 in | Wt 184.0 lb

## 2019-08-04 DIAGNOSIS — J4520 Mild intermittent asthma, uncomplicated: Secondary | ICD-10-CM | POA: Diagnosis not present

## 2019-08-04 DIAGNOSIS — M25512 Pain in left shoulder: Secondary | ICD-10-CM | POA: Insufficient documentation

## 2019-08-04 DIAGNOSIS — I10 Essential (primary) hypertension: Secondary | ICD-10-CM

## 2019-08-04 DIAGNOSIS — E782 Mixed hyperlipidemia: Secondary | ICD-10-CM

## 2019-08-04 DIAGNOSIS — D869 Sarcoidosis, unspecified: Secondary | ICD-10-CM | POA: Insufficient documentation

## 2019-08-04 LAB — LIPID PANEL
Cholesterol: 205 mg/dL — ABNORMAL HIGH (ref 0–200)
HDL: 42.6 mg/dL (ref >39.00)
LDL Cholesterol: 133 mg/dL — ABNORMAL HIGH (ref 0–99)
NonHDL: 162.82
Total CHOL/HDL Ratio: 5
Triglycerides: 147 mg/dL (ref 0.0–149.0)
VLDL: 29.4 mg/dL (ref 0.0–40.0)

## 2019-08-04 NOTE — Assessment & Plan Note (Signed)
BP controlled. Cont amlodipine 

## 2019-08-04 NOTE — Patient Instructions (Signed)
#  Cholesterol - continue regular exercise - continue working on diet  Blood pressure - continue medication  - blood pressure with great control

## 2019-08-04 NOTE — Assessment & Plan Note (Signed)
Improved on advair. Following with Dr. Marcos Eke with recent diagnosis of sarcodois and stable lung CT. Cont advair and pulm follow-up

## 2019-08-04 NOTE — Progress Notes (Signed)
Subjective:     Meghan Leon is a 64 y.o. female presenting for Hypertension     HPI  #asthma/sarcoidosis - using advair - the mass is stable on most recent CT scan - used nebulizer last night - her breathing treatment improved her 20%  #HLD - has not been doing great with diet - has been exercising - has a bicycle, swimming - is fasting today  #Shoulder pain - nagging pain - injury occurred with swimming - reaching back motion - using heat wrap on the area - no issues with strength - worse with swimming - breast stroke motion   Review of Systems  04/06/2019: Clinic - HTN controlled. HLD - candidate for therapy, lifestyle.  05/27/2019: Pulm - Asthma exacerbation - prednisone course and start Advair and prn albuterol. Abnormal CT scan - diagnosed with Sarcoidosis   Social History   Tobacco Use  Smoking Status Never Smoker  Smokeless Tobacco Never Used        Objective:    BP Readings from Last 3 Encounters:  08/04/19 122/82  07/01/19 (!) 160/94  06/29/19 128/84   Wt Readings from Last 3 Encounters:  08/04/19 184 lb (83.5 kg)  07/01/19 185 lb 4 oz (84 kg)  06/29/19 182 lb (82.6 kg)    BP 122/82   Temp 98.6 F (37 C) (Temporal)   Ht 5' 3.5" (1.613 m)   Wt 184 lb (83.5 kg)   BMI 32.08 kg/m    Physical Exam Constitutional:      General: She is not in acute distress.    Appearance: She is well-developed. She is not diaphoretic.  HENT:     Right Ear: External ear normal.     Left Ear: External ear normal.  Eyes:     Conjunctiva/sclera: Conjunctivae normal.  Cardiovascular:     Rate and Rhythm: Normal rate and regular rhythm.     Heart sounds: No murmur heard.   Pulmonary:     Effort: Pulmonary effort is normal. No respiratory distress.     Breath sounds: Rales (faint, scattered) present. No wheezing.  Musculoskeletal:        General: No swelling or tenderness.     Left shoulder: No swelling, deformity, tenderness or bony tenderness.  Normal range of motion. Normal strength.     Cervical back: Neck supple.     Comments: Normal rotator cuff. No signs of impingement.   Skin:    General: Skin is warm and dry.     Capillary Refill: Capillary refill takes less than 2 seconds.  Neurological:     Mental Status: She is alert. Mental status is at baseline.  Psychiatric:        Mood and Affect: Mood normal.        Behavior: Behavior normal.      The 10-year ASCVD risk score Mikey Bussing DC Jr., et al., 2013) is: 9.2%   Values used to calculate the score:     Age: 20 years     Sex: Female     Is Non-Hispanic African American: Yes     Diabetic: No     Tobacco smoker: No     Systolic Blood Pressure: 086 mmHg     Is BP treated: Yes     HDL Cholesterol: 32 mg/dL     Total Cholesterol: 205 mg/dL      Assessment & Plan:   Problem List Items Addressed This Visit      Cardiovascular and Mediastinum   Essential hypertension -  Primary    BP controlled. Cont amlodipine.         Respiratory   Asthma    Improved on advair. Following with Dr. Marcos Eke with recent diagnosis of sarcodois and stable lung CT. Cont advair and pulm follow-up         Other   Hyperlipidemia    Explained benefit of statin for reduced risk of MI/Stroke. Pt concerned about side effects and that her husband got diabetes from statins. Explained that statins do not cause diabetes. She reiterated that she preferred to not take medication and continue to work on lifestyle. Repeat lipids today      Relevant Orders   Lipid panel   Acute pain of left shoulder    Rotator cuff intact. Unable to reproduced pain on exam. Advised continued rest and return if not improving in the next 1-2 weeks.           Return in about 1 year (around 08/03/2020).  Lynnda Child, MD  This visit occurred during the SARS-CoV-2 public health emergency.  Safety protocols were in place, including screening questions prior to the visit, additional usage of staff PPE, and  extensive cleaning of exam room while observing appropriate contact time as indicated for disinfecting solutions.

## 2019-08-04 NOTE — Assessment & Plan Note (Signed)
Rotator cuff intact. Unable to reproduced pain on exam. Advised continued rest and return if not improving in the next 1-2 weeks.

## 2019-08-04 NOTE — Assessment & Plan Note (Signed)
Explained benefit of statin for reduced risk of MI/Stroke. Pt concerned about side effects and that her husband got diabetes from statins. Explained that statins do not cause diabetes. She reiterated that she preferred to not take medication and continue to work on lifestyle. Repeat lipids today

## 2019-08-16 ENCOUNTER — Other Ambulatory Visit: Payer: Self-pay

## 2019-08-16 ENCOUNTER — Ambulatory Visit: Payer: 59 | Admitting: Pulmonary Disease

## 2019-08-16 ENCOUNTER — Encounter: Payer: Self-pay | Admitting: Pulmonary Disease

## 2019-08-16 VITALS — BP 124/68 | HR 89 | Temp 97.5°F | Ht 63.5 in | Wt 185.2 lb

## 2019-08-16 DIAGNOSIS — J4540 Moderate persistent asthma, uncomplicated: Secondary | ICD-10-CM | POA: Diagnosis not present

## 2019-08-16 DIAGNOSIS — D869 Sarcoidosis, unspecified: Secondary | ICD-10-CM

## 2019-08-16 NOTE — Progress Notes (Signed)
Subjective:    Patient ID: Meghan Leon, female    DOB: 23-Apr-1955, 64 y.o.   MRN: [id number]  HPI Patient is a 64 year old lifelong never smoker, with a history of asthma who follows for the same.  She also follows on the issue of an abnormal imaging of the lung.  This is a scheduled visit.  Since her last visit on 12 May she has done well.  She is however very erratic in taking her Advair regularly.  He sometimes goes days without taking it and then becomes very short of breath and starts taking it again with good results.  She has had no fevers chills or sweats.  Previously she was noted to have abnormal imaging of the lung she had a repeat CT with findings consistent with sarcoidosis these have been stable.  She also had pulmonary function testing that showed that she has moderate obstructive defect with a very significant bronchodilator response consistent with asthma.  Deviously also she has shown eosinophilia on differential.  19 July 2019 PFTs: FEV1 1.19 L or 60% predicted, FVC 1.78 L or 70% predicted.  Postbronchodilator there is a 21% net change on FEV1 to 1.45 L or 73% predicted and equally on 19% change in FVC demonstrating decreased air trapping.  Lung volumes are mildly reduced consistent with the patient's sarcoidosis.  Diffusion capacity mildly reduced when alveolar volume is taken into account. CT scan of the chest performed the same day shows calcified mediastinal lymphadenopathy consistent with sarcoidosis and fibrotic changes on the right upper lobe clustered perilymphatic and perifissural nodules consistent with sarcoidosis.  The findings are stable.  I have reviewed the films and the PFTs with the patient.   Review of Systems A 10 point review of systems was performed and it is as noted above otherwise negative.  No Known Allergies  Current Meds  Medication Sig  . albuterol (PROVENTIL) (2.5 MG/3ML) 0.083% nebulizer solution Take 3 mLs (2.5 mg total) by nebulization every  4 (four) hours as needed for wheezing or shortness of breath.  Marland Kitchen albuterol (VENTOLIN HFA) 108 (90 Base) MCG/ACT inhaler Inhale 2 puffs into the lungs every 6 (six) hours as needed for wheezing or shortness of breath.  Marland Kitchen amLODipine (NORVASC) 5 MG tablet Take 1 tablet (5 mg total) by mouth daily.  . fluticasone-salmeterol (ADVAIR HFA) 115-21 MCG/ACT inhaler Inhale 2 puffs into the lungs 2 (two) times daily.  . Nebulizers (COMPRESSOR/NEBULIZER) MISC 1 Units by Does not apply route every 4 (four) hours as needed.   Immunization History  Administered Date(s) Administered  . Influenza-Unspecified 12/08/2018  . PFIZER SARS-COV-2 Vaccination 03/11/2019, 04/01/2019  . Tdap 06/03/2005       Objective:   Physical Exam BP 124/68 (BP Location: Right Arm, Cuff Size: Normal)   Pulse 89   Temp (!) 97.5 F (36.4 C) (Temporal)   Ht 5' 3.5" (1.613 m)   Wt 185 lb 3.2 oz (84 kg)   SpO2 98%   BMI 32.29 kg/m   GENERAL: Awake, alert, fully ambulatory.  No respiratory distress. HEAD: Normocephalic, atraumatic.  EYES: Pupils equal, round, reactive to light.  No scleral icterus.  MOUTH: Nose/mouth/throat not examined due to masking requirements for COVID 19. NECK: Supple. No thyromegaly. Trachea midline. No JVD.  No adenopathy. PULMONARY: Lungs clear to auscultation bilaterally.  Good air entry bilaterally. CARDIOVASCULAR: S1 and S2. Regular rate and rhythm.  No rubs murmurs or gallops heard. GASTROINTESTINAL: No abdominal distention noted. MUSCULOSKELETAL: No joint deformity, no clubbing, no  edema.  NEUROLOGIC: Awake, alert, no focal deficits noted.  Fluent speech, no gait disturbance noted. SKIN: Intact,warm,dry.  Limited exam shows no rashes. PSYCH: Mood and behavior normal.     Assessment & Plan:     ICD-10-CM   1. Moderate persistent asthma in adult without complication  J45.40 Allergen Panel (27) + IGE    CBC w/Diff    Angiotensin converting enzyme   Patient was instructed to take Advair  daily Will obtain RAST panel Follow-up 3 months time  2. Sarcoidosis  D86.9    Obtain ACE level Follow-up CT of the chest 6 months   Orders Placed This Encounter  Procedures  . Allergen Panel (27) + IGE    Standing Status:   Future    Standing Expiration Date:   08/15/2020  . CBC w/Diff    Standing Status:   Future    Standing Expiration Date:   08/15/2020  . Angiotensin converting enzyme    Standing Status:   Future    Standing Expiration Date:   08/15/2020   Discussion:  Patient admits to being erratic with her use of Advair.  I instructed her that she has a significant postbronchodilator response and should use Advair daily whether she feels she needs it or not.  This will prevent airway remodeling.  Additionally she has sarcoidosis by CT criteria.  Her PFTs reflect a combined obstructive and restrictive physiology.  We are obtaining laboratory data as above.  We will see the patient in follow-up in 3 months time she is to contact us prior to that time should any new difficulties arise.   Gailen Shelter, MD [facility]   *This note was dictated using voice recognition software/Dragon.  Despite best efforts to proofread, errors can occur which can change the meaning.  Any change was purely unintentional.

## 2019-08-16 NOTE — Patient Instructions (Signed)
Discussed today:   Use your Advair twice a day whether you think you needed or not.  You have pretty significant asthma and Advair should keep the inflammation down.  We also discussed that you have sarcoidosis but it appears to be stable and "burnt out".  We will repeat a CT scan of the chest in 6 months just to make sure things are stable.  This will be ordered when you come back to see [place].  Are checking blood work for allergies.  We will see you in follow-up in 3 months time call sooner should any new difficulties arise.

## 2019-08-23 ENCOUNTER — Other Ambulatory Visit
Admission: RE | Admit: 2019-08-23 | Discharge: 2019-08-23 | Disposition: A | Discharge status: Home / Self Care | Payer: 59 | Attending: Pulmonary Disease | Admitting: Pulmonary Disease

## 2019-08-23 DIAGNOSIS — J4540 Moderate persistent asthma, uncomplicated: Secondary | ICD-10-CM | POA: Insufficient documentation

## 2019-08-23 LAB — CBC WITH DIFFERENTIAL/PLATELET
Abs Immature Granulocytes: 0.02 10*3/uL (ref 0.00–0.07)
Basophils Absolute: 0.1 10*3/uL (ref 0.0–0.1)
Basophils Relative: 1 %
Eosinophils Absolute: 0.4 10*3/uL (ref 0.0–0.5)
Eosinophils Relative: 4 %
HCT: 41.1 % (ref 36.0–46.0)
Hemoglobin: 13.3 g/dL (ref 12.0–15.0)
Immature Granulocytes: 0 %
Lymphocytes Relative: 40 %
Lymphs Abs: 3.2 10*3/uL (ref 0.7–4.0)
MCH: 25.5 pg — ABNORMAL LOW (ref 26.0–34.0)
MCHC: 32.4 g/dL (ref 30.0–36.0)
MCV: 78.9 fL — ABNORMAL LOW (ref 80.0–100.0)
Monocytes Absolute: 0.6 10*3/uL (ref 0.1–1.0)
Monocytes Relative: 8 %
Neutro Abs: 3.7 10*3/uL (ref 1.7–7.7)
Neutrophils Relative %: 47 %
Platelets: 313 10*3/uL (ref 150–400)
RBC: 5.21 MIL/uL — ABNORMAL HIGH (ref 3.87–5.11)
RDW: 14.8 % (ref 11.5–15.5)
WBC: 7.9 10*3/uL (ref 4.0–10.5)
nRBC: 0 % (ref 0.0–0.2)

## 2019-08-24 LAB — ANGIOTENSIN CONVERTING ENZYME: Angiotensin-Converting Enzyme: 87 U/L — ABNORMAL HIGH (ref 14–82)

## 2019-08-27 LAB — ALLERGEN PANEL (27) + IGE
Alternaria Alternata IgE: <0.1 kU/L
Aspergillus Fumigatus IgE: <0.1 kU/L
Bahia Grass IgE: <0.1 kU/L
Bermuda Grass IgE: <0.1 kU/L
Cat Dander IgE: <0.1 kU/L
Cedar, Mountain IgE: <0.1 kU/L
Cladosporium Herbarum IgE: <0.1 kU/L
Cocklebur IgE: <0.1 kU/L
Cockroach, American IgE: <0.1 kU/L
Common Silver Birch IgE: <0.1 kU/L
D Farinae IgE: <0.1 kU/L
D Pteronyssinus IgE: <0.1 kU/L
Dog Dander IgE: <0.1 kU/L
Elm, American IgE: <0.1 kU/L
Hickory, White IgE: <0.1 kU/L
IgE (Immunoglobulin E), Serum: 50 IU/mL (ref 6–495)
Johnson Grass IgE: <0.1 kU/L
Kentucky Bluegrass IgE: <0.1 kU/L
Maple/Box Elder IgE: <0.1 kU/L
Mucor Racemosus IgE: <0.1 kU/L
Oak, White IgE: <0.1 kU/L
Penicillium Chrysogen IgE: <0.1 kU/L
Pigweed, Rough IgE: <0.1 kU/L
Plantain, English IgE: <0.1 kU/L
Ragweed, Short IgE: <0.1 kU/L
Setomelanomma Rostrat: <0.1 kU/L
Timothy Grass IgE: <0.1 kU/L
White Mulberry IgE: <0.1 kU/L

## 2019-10-04 ENCOUNTER — Other Ambulatory Visit: Payer: Self-pay

## 2019-10-04 ENCOUNTER — Encounter: Payer: Self-pay | Admitting: Pulmonary Disease

## 2019-10-04 ENCOUNTER — Ambulatory Visit: Payer: 59 | Admitting: Pulmonary Disease

## 2019-10-04 VITALS — BP 130/90 | HR 79 | Temp 97.1°F | Ht 63.5 in | Wt 179.6 lb

## 2019-10-04 DIAGNOSIS — J4541 Moderate persistent asthma with (acute) exacerbation: Secondary | ICD-10-CM

## 2019-10-04 DIAGNOSIS — J0141 Acute recurrent pansinusitis: Secondary | ICD-10-CM

## 2019-10-04 DIAGNOSIS — D869 Sarcoidosis, unspecified: Secondary | ICD-10-CM

## 2019-10-04 MED ORDER — PREDNISONE 10 MG (21) PO TBPK: ORAL_TABLET | ORAL | 0 refills | Status: DC

## 2019-10-04 MED ORDER — AZITHROMYCIN 250 MG PO TABS: ORAL_TABLET | ORAL | 0 refills | Status: AC

## 2019-10-04 NOTE — Progress Notes (Signed)
Subjective:    Patient ID: Meghan Leon, female    DOB: 1955/06/06, 64 y.o.   MRN: [id number] Patient Care Team: Eden Emms, NP as PCP - General (Nurse Practitioner)  Chief Complaint  Patient presents with   Follow-up    has been sick.  feels wheezy in her chest, nebulizer keeps at bay.  Cough with clear phlem.    HPI Patient is a 64 year old lifelong never smoker, with a history of asthma who follows for the same.  She also follows on the issue of an abnormal imaging of the lung.  This is an acute visit.  She was last seen on 29 June and at that time she was doing well.  At her prior visit she was instructed to use her Advair consistently and not let her symptoms escalate before she uses it.  She notes that she developed upper respiratory infection approximately a week ago.  She feels tight and "wheezy" in her chest.  She has been using her albuterol nebulizer rescue medication.  Cough is productive of clear to yellowish sputum and no hemoptysis is noted.  She has had no fevers, chills or sweats but has had some generalized malaise.  She has noted sinus pressure anteriorly and purulent nasal discharge.  She does not endorse any other symptomatology today.     Review of Systems A 10 point review of systems was performed and it is as noted above otherwise negative.   Patient Active Problem List   Diagnosis Date Noted   Sarcoidosis 08/04/2019   Acute pain of left shoulder 08/04/2019   Essential hypertension 04/06/2019   Asthma 04/06/2019   Hyperlipidemia 04/06/2019   Social History   Tobacco Use   Smoking status: Never   Smokeless tobacco: Never   Tobacco comments:    never  Substance Use Topics   Alcohol use: No   No Known Allergies  Current Meds  Medication Sig   albuterol (PROVENTIL) (2.5 MG/3ML) 0.083% nebulizer solution Take 3 mLs (2.5 mg total) by nebulization every 4 (four) hours as needed for wheezing or shortness of breath.   albuterol (VENTOLIN HFA) 108  (90 Base) MCG/ACT inhaler Inhale 2 puffs into the lungs every 6 (six) hours as needed for wheezing or shortness of breath.   amLODipine (NORVASC) 5 MG tablet Take 1 tablet (5 mg total) by mouth daily.   fluticasone-salmeterol (ADVAIR HFA) 115-21 MCG/ACT inhaler Inhale 2 puffs into the lungs 2 (two) times daily.   Immunization History  Administered Date(s) Administered   Fluad Quad(high Dose 65+) 12/12/2021   Influenza-Unspecified 12/08/2018   PFIZER(Purple Top)SARS-COV-2 Vaccination 03/11/2019, 04/01/2019   Tdap 06/03/2005   Zoster Recombinant(Shingrix) 11/09/2019      Objective:   Physical Exam BP 130/90 (BP Location: Left Arm, Cuff Size: Normal)   Pulse 79   Temp (!) 97.1 F (36.2 C) (Temporal)   Ht 5' 3.5" (1.613 m)   Wt 179 lb 9.6 oz (81.5 kg)   SpO2 95%   BMI 31.32 kg/m   SpO2: 95 % O2 Device: None (Room air)  GENERAL: Awake, alert, fully ambulatory.  Appears acutely ill but nontoxic, no respiratory distress, no conversational dyspnea. HEAD: Normocephalic, atraumatic.  EYES: Pupils equal, round, reactive to light.  No scleral icterus.  MOUTH: Nose/mouth/throat not examined due to masking requirements for COVID 19. NECK: Supple. No thyromegaly. Trachea midline. No JVD.  No adenopathy. PULMONARY: Good air entry bilaterally.  Few rhonchi noted bilaterally. CARDIOVASCULAR: S1 and S2. Regular rate and rhythm.  No rubs murmurs or gallops heard. GASTROINTESTINAL: No abdominal distention noted. MUSCULOSKELETAL: No joint deformity, no clubbing, no edema.  NEUROLOGIC: Awake, alert, no focal deficits noted.  Fluent speech, no gait disturbance noted. SKIN: Intact,warm,dry.  Limited exam shows no rashes. PSYCH: Mood and behavior normal.      Assessment & Plan:     ICD-10-CM   1. Moderate persistent asthma with (acute) exacerbation  J45.41    Continue bronchodilators Prednisone taper Azithromycin Dosepak    2. Acute recurrent pansinusitis  J01.41    Nasal  hygiene Azithromycin    3. Sarcoidosis  D86.9    Appears to be quiescent     Meds ordered this encounter  Medications   azithromycin (ZITHROMAX Z-PAK) 250 MG tablet    Sig: Take 2 tablets (500 mg) on  Day 1,  followed by 1 tablet (250 mg) once daily on Days 2 through 5.    Dispense:  6 each    Refill:  0   predniSONE (STERAPRED UNI-PAK 21 TAB) 10 MG (21) TBPK tablet    Sig: Take as directed in the package.  This is a taper package.    Dispense:  1 each    Refill:  0   Will see the patient in follow-up if symptoms do not improve with the above interventions.  Otherwise she is to follow-up in 3 months time.  Gailen Shelter, MD [facility]    *This note was dictated using voice recognition software/Dragon.  Despite best efforts to proofread, errors can occur which can change the meaning. Any transcriptional errors that result from this process are unintentional and may not be fully corrected at the time of dictation.

## 2019-10-04 NOTE — Patient Instructions (Signed)
We are sending a prednisone taper and on azithromycin Z-Pak to the pharmacy.  Also sending an albuterol inhaler refill.  See you in follow-up in 3 months time call sooner should any new problems arise.

## 2019-11-09 ENCOUNTER — Ambulatory Visit (INDEPENDENT_AMBULATORY_CARE_PROVIDER_SITE_OTHER): Payer: 59 | Admitting: Family Medicine

## 2019-11-09 ENCOUNTER — Other Ambulatory Visit: Payer: Self-pay

## 2019-11-09 VITALS — BP 124/80 | HR 73 | Temp 97.3°F | Ht 64.0 in | Wt 184.5 lb

## 2019-11-09 DIAGNOSIS — I10 Essential (primary) hypertension: Secondary | ICD-10-CM

## 2019-11-09 DIAGNOSIS — E782 Mixed hyperlipidemia: Secondary | ICD-10-CM | POA: Diagnosis not present

## 2019-11-09 DIAGNOSIS — Z0000 Encounter for general adult medical examination without abnormal findings: Secondary | ICD-10-CM

## 2019-11-09 DIAGNOSIS — Z23 Encounter for immunization: Secondary | ICD-10-CM | POA: Diagnosis not present

## 2019-11-09 NOTE — Progress Notes (Signed)
Annual Exam   Chief Complaint:  Chief Complaint  Patient presents with  . Annual Exam    no concerns    History of Present Illness:  Meghan Leon is a 64 y.o. No obstetric history on file. who LMP was No LMP recorded. Patient has had a hysterectomy., presents today for her annual examination.      Nutrition She does get adequate calcium and Vitamin D in her diet. Diet: healthy overall, spinach smoothies for breakfast, ice cream snack, grilled lunch - home garden Exercise: walking with daughter a few times a week, yardword, gardening  Safety The patient wears seatbelts: yes.     The patient feels safe at home and in their relationships: yes.   Menstrual:  No issues  GYN She is single partner, contraception - status post hysterectomy.    Cervical Cancer Screening (21-65):   Last pap smear was 2 years ago. No hx of abnormal pap smears  Breast Cancer Screening (Age 63-74):  There is FH of breast cancer. There is no FH of ovarian cancer. BRCA screening Not Indicated.  Last Mammogram: 10/2018 The patient does want a mammogram this year.    Colon Cancer Screening:  Age 19-75 yo - benefits outweigh the risk. Adults 71-85 yo who have never been screened benefit.  Benefits: 134000 people in 2016 will be diagnosed and 49,000 will die - early detection helps [place]ms: Complications 2/2 to colonoscopy High Risk (Colonoscopy): genetic disorder (Lynch syndrome or familial adenomatous polyposis), personal hx of IBD, previous adenomatous polyp, or previous colorectal cancer, FamHx start 10 years before the age at diagnosis, increased in males and black race  Options:  FIT - looks for hemoglobin (blood in the stool) - specific and fairly sensitive - must be done annually Cologuard - looks for DNA and blood - more sensitive - therefore can have more false positives, every 3 years Colonoscopy - every 10 years if normal - sedation, bowl prep, must have someone drive you  Shared  decision making and the patient had decided to do colonoscopy - not due until 2027.   Social History   Tobacco Use  Smoking Status Never Smoker  Smokeless Tobacco Never Used    Lung Cancer Screening (Ages 33-29): not applicable   Weight Wt Readings from Last 3 Encounters:  11/09/19 184 lb 8 oz (83.7 kg)  10/04/19 179 lb 9.6 oz (81.5 kg)  08/16/19 185 lb 3.2 oz (84 kg)   Patient has high BMI  BMI Readings from Last 1 Encounters:  11/09/19 31.67 kg/m     Chronic disease screening Blood pressure monitoring:  BP Readings from Last 3 Encounters:  11/09/19 124/80  10/04/19 130/90  08/16/19 124/68    Lipid Monitoring: Indication for screening: age >27, obesity, diabetes, family hx, CV risk factors.  Lipid screening: Yes  Lab Results  Component Value Date   CHOL 205 (H) 08/04/2019   HDL 42.60 08/04/2019   LDLCALC 133 (H) 08/04/2019   TRIG 147.0 08/04/2019   CHOLHDL 5 08/04/2019     Diabetes Screening: age >68, overweight, family hx, PCOS, hx of gestational diabetes, at risk ethnicity Diabetes Screening screening: Yes  No results found for: HGBA1C   Past Medical History:  Diagnosis Date  . Allergy 15 years ago   chemical exposure  . Asthma   . Hypertension January 2021   ED visit  . Medical history non-contributory     Past Surgical History:  Procedure Laterality Date  . ABDOMINAL HYSTERECTOMY  Age  40  . CESAREAN SECTION    . COLONOSCOPY WITH PROPOFOL N/A 04/13/2015   Procedure: COLONOSCOPY WITH PROPOFOL;  Surgeon: Lollie Sails, MD;  Location: [facility] ENDOSCOPY;  Service: Endoscopy;  Laterality: N/A;    Prior to Admission medications   Medication Sig Start Date End Date Taking? Authorizing Provider  albuterol (PROVENTIL) (2.5 MG/3ML) 0.083% nebulizer solution Take 3 mLs (2.5 mg total) by nebulization every 4 (four) hours as needed for wheezing or shortness of breath. 05/24/19  Yes Paulette Blanch, MD  albuterol (VENTOLIN HFA) 108 (90 Base) MCG/ACT inhaler  Inhale 2 puffs into the lungs every 6 (six) hours as needed for wheezing or shortness of breath. 03/11/19  Yes Tyler Pita, MD  amLODipine (NORVASC) 5 MG tablet Take 1 tablet (5 mg total) by mouth daily. 06/21/19  Yes Lesleigh Noe, MD  fluticasone-salmeterol (ADVAIR HFA) [phone] MCG/ACT inhaler Inhale 2 puffs into the lungs 2 (two) times daily. 05/27/19  Yes Tyler Pita, MD  Nebulizers (COMPRESSOR/NEBULIZER) MISC 1 Units by Does not apply route every 4 (four) hours as needed. 05/24/19  Yes Paulette Blanch, MD    No Known Allergies  Gynecologic History: No LMP recorded. Patient has had a hysterectomy.  Obstetric History: No obstetric history on file.  Social History   Socioeconomic History  . Marital status: Married    Spouse name: Meghan Leon  . Number of children: 2  . Years of education: college  . Highest education level: Not on file  Occupational History  . Not on file  Tobacco Use  . Smoking status: Never Smoker  . Smokeless tobacco: Never Used  Vaping Use  . Vaping Use: Never used  Substance and Sexual Activity  . Alcohol use: No  . Drug use: No  . Sexual activity: Yes    Birth control/protection: Surgical  Other Topics Concern  . 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 Difficulty of Paying Living Expenses: Not on file  Food Insecurity:   . Worried About Charity fundraiser in the Last Year: Not on file  . Ran Out of Food in the Last Year: Not on file  Transportation  Needs:   . Lack of Transportation (Medical): Not on file  . Lack of Transportation (Non-Medical): Not on file  Physical Activity:   . Days of Exercise per Week: Not on file  . Minutes of Exercise per Session: Not on file  Stress:   . Feeling of Stress : Not on file  Social Connections:   . Frequency of Communication with Friends and Family: Not on file  . Frequency of Social Gatherings with Friends and Family: Not on file  . Attends Religious Services: Not on file  . Active Member of Clubs or Organizations: Not on file  . Attends Archivist Meetings: Not on file  .  Marital Status: Not on file  Intimate Partner Violence:   . Fear of Current or Ex-Partner: Not on file  . Emotionally Abused: Not on file  . Physically Abused: Not on file  . Sexually Abused: Not on file    Family History  Problem Relation Age of Onset  . Breast cancer Daughter 43  . Alzheimer's disease Mother   . Depression Mother   . Hypertension Mother   . Alcohol abuse Father   . Diabetes Father   . Liver cancer Father   . Diabetes Brother   . Alcohol abuse Brother   . Diabetes Sister   . Alcohol abuse Sister   . Arthritis Sister   . COPD Sister   . Depression Sister   . Drug abuse Sister   . Hypertension Sister   . Hyperlipidemia Sister   . Alcohol abuse Sister   . Hyperlipidemia Sister   . Hypertension Sister   . Diabetes Sister   . Hypertension Sister   . Hypertension Daughter     Review of Systems  Constitutional: Negative for chills and fever.  HENT: Negative for congestion and sore throat.   Eyes: Negative for blurred vision and double vision.  Respiratory: Negative for shortness of breath.   Cardiovascular: Negative for chest pain.  Gastrointestinal: Negative for heartburn, nausea and vomiting.  Genitourinary: Negative.   Musculoskeletal: Negative.  Negative for myalgias.  Skin: Negative for rash.  Neurological: Negative for dizziness and headaches.  Endo/Heme/Allergies: Does  not bruise/bleed easily.  Psychiatric/Behavioral: Negative for depression. The patient is not nervous/anxious.      Physical Exam BP 124/80   Pulse 73   Temp (!) 97.3 F (36.3 C) (Temporal)   Ht _0  (1.626 m)   Wt 184 lb 8 oz (83.7 kg)   SpO2 97%   BMI 31.67 kg/m    BP Readings from Last 3 Encounters:  11/09/19 124/80  10/04/19 130/90  08/16/19 124/68      Physical Exam Constitutional:      General: She is not in acute distress.    Appearance: She is well-developed. She is not diaphoretic.  HENT:     Head: Normocephalic and atraumatic.     Right Ear: External ear normal.     Left Ear: External ear normal.     Nose: Nose normal.  Eyes:     General: No scleral icterus.    Conjunctiva/sclera: Conjunctivae normal.  Cardiovascular:     Rate and Rhythm: Normal rate and regular rhythm.     Heart sounds: No murmur heard.   Pulmonary:     Effort: Pulmonary effort is normal. No respiratory distress.     Breath sounds: Normal breath sounds. No wheezing.  Abdominal:     General: Bowel sounds are normal. There is no distension.     Palpations: Abdomen is soft. There is no mass.     Tenderness: There is no abdominal tenderness. There is no guarding or rebound.  Musculoskeletal:        General: Normal range of motion.     Cervical back: Neck supple.  Lymphadenopathy:     Cervical: No cervical adenopathy.  Skin:    General: Skin is warm and dry.     Capillary Refill: Capillary refill takes less than 2 seconds.  Neurological:     Mental Status: She is alert and oriented to person, place, and time.     Deep Tendon Reflexes: Reflexes normal.  Psychiatric:        Behavior:  Behavior normal.     Results:  PHQ-9:   Depression screen [facility] 2/9 11/09/2019 04/06/2019  Decreased Interest 0 0  Down, Depressed, Hopeless 0 0  PHQ - 2 Score 0 0       Assessment: 64 y.o. No obstetric history on file. female here for routine annual physical examination.  Plan: Problem List  Items Addressed This Visit      Cardiovascular and Mediastinum   Essential hypertension     Other   Hyperlipidemia    Other Visit Diagnoses    Annual physical exam    -  Primary   Need for shingles vaccine       Relevant Orders   Varicella-zoster vaccine IM (Completed)      Screening: -- Blood pressure screen normal -- cholesterol screening: not due for screening -- Weight screening: overweight: continue to monitor -- Diabetes Screening: not due for screening -- Nutrition: Encouraged healthy diet  The 10-year ASCVD risk score Mikey Bussing DC Jr., et al., 2013) is: 8.8%   Values used to calculate the score:     Age: 40 years     Sex: Female     Is Non-Hispanic African American: Yes     Diabetic: No     Tobacco smoker: No     Systolic Blood Pressure: 010 mmHg     Is BP treated: Yes     HDL Cholesterol: 42.6 mg/dL     Total Cholesterol: 205 mg/dL  -- Statin therapy for Age 69-75 with CVD risk >7.5%  Psych -- Depression screening (PHQ-9):   Depression screen [facility] 2/9 11/09/2019 04/06/2019  Decreased Interest 0 0  Down, Depressed, Hopeless 0 0  PHQ - 2 Score 0 0      Safety -- tobacco screening: not using -- alcohol screening:  low-risk usage. -- no evidence of domestic violence or intimate partner violence.   Cancer Screening -- pap smear - will obtain records and do next year if needed -- family history of breast cancer screening: done. not at high risk. -- Mammogram - pt will schedule -- Colon cancer (age 64+)-- up to date  Immunizations Immunization History  Administered Date(s) Administered  . Influenza-Unspecified 12/08/2018  . PFIZER SARS-COV-2 Vaccination 03/11/2019, 04/01/2019  . Tdap 06/03/2005  . Zoster Recombinat (Shingrix) 11/09/2019    -- flu vaccine - scheduled for Oct  -- TDAP q10 years up to date -- Shingles (age >55) - will get today -- Covid-19 Vaccine up to date   Encouraged healthy diet and exercise. Encouraged regular vision and dental  care.    Lesleigh Noe, MD

## 2019-11-09 NOTE — Patient Instructions (Addendum)
Schedule your mammogram   [facility]  1) 800 units of Vitamin D daily 2) Get 1200 mg of elemental calcium --- this is best from your diet. Try to track how much calcium you get on a typical day. You could find ways to add more (dairy products, leafy greens). Take a supplement for whatever you don't typically get so you reach 1200 mg of calcium.  3) Physical activity (ideally weight bearing) - like walking briskly 30 minutes 5 days a week.

## 2019-11-16 ENCOUNTER — Other Ambulatory Visit: Payer: Self-pay | Admitting: Family Medicine

## 2019-11-16 DIAGNOSIS — Z1231 Encounter for screening mammogram for malignant neoplasm of breast: Secondary | ICD-10-CM

## 2019-12-12 ENCOUNTER — Ambulatory Visit
Admission: RE | Admit: 2019-12-12 | Discharge: 2019-12-12 | Disposition: A | Discharge status: Home / Self Care | Payer: 59 | Source: Ambulatory Visit | Attending: Family Medicine | Admitting: Family Medicine

## 2019-12-12 ENCOUNTER — Other Ambulatory Visit: Payer: Self-pay

## 2019-12-12 DIAGNOSIS — Z1231 Encounter for screening mammogram for malignant neoplasm of breast: Secondary | ICD-10-CM | POA: Diagnosis not present

## 2019-12-26 ENCOUNTER — Encounter: Payer: Self-pay | Admitting: Family Medicine

## 2019-12-28 ENCOUNTER — Other Ambulatory Visit: Payer: Self-pay | Admitting: Family Medicine

## 2020-01-10 ENCOUNTER — Encounter: Payer: Self-pay | Admitting: Family Medicine

## 2020-01-17 ENCOUNTER — Encounter: Payer: Self-pay | Admitting: Pulmonary Disease

## 2020-01-17 ENCOUNTER — Other Ambulatory Visit: Payer: Self-pay

## 2020-01-17 ENCOUNTER — Ambulatory Visit: Payer: 59 | Admitting: Pulmonary Disease

## 2020-01-17 VITALS — BP 126/72 | HR 77 | Temp 97.3°F | Ht 64.0 in | Wt 191.0 lb

## 2020-01-17 DIAGNOSIS — J4541 Moderate persistent asthma with (acute) exacerbation: Secondary | ICD-10-CM

## 2020-01-17 DIAGNOSIS — D869 Sarcoidosis, unspecified: Secondary | ICD-10-CM

## 2020-01-17 DIAGNOSIS — R0602 Shortness of breath: Secondary | ICD-10-CM | POA: Diagnosis not present

## 2020-01-17 NOTE — Patient Instructions (Signed)
We will call you with the results of your CT scan  We will see you in follow-up in 4 months time call sooner should any new problems arise  Continue taking Advair twice a day

## 2020-01-17 NOTE — Progress Notes (Signed)
Subjective:    Patient ID: Meghan Leon, female    DOB: [birth year], 64 y.o.   MRN: [id number]  HPI Meghan Leon is a 64 year old lifelong never smoker with a history of pulse for the same.  She also has sarcoidosis.  Her last visit here was in August, at that time she had a flare of her sinusitis.  She did well with treatment with Azithromycin and prednisone.  She has done well also on Advair twice a day.  She prefers HFA due to intolerance of powdered metered-dose inhalers which cause cough.  She has not had any fevers, chills or sweats.  She is to have a CT scan of the chest in 2 days.  If this remains stable she will not need further imaging.  Does not describe any ocular changes or skin changes.  With regards to her sarcoidosis this has been more of an incidental finding for her.  Overall she feels well and looks well.   DATA: 19 July 2019 PFTs: FEV1 1.19 L or 60% predicted, FVC 1.78 L or 70% predicted.  Postbronchodilator there is a 21% net change on FEV1 to 1.45 L or 73% predicted and equally on 19% change in FVC demonstrating decreased air trapping.  Lung volumes are mildly reduced consistent with the patient's sarcoidosis.  Diffusion capacity mildly reduced when alveolar volume is taken into account. 19 July 2019 CT scan of the chest:  shows calcified mediastinal lymphadenopathy consistent with sarcoidosis and fibrotic changes on the right upper lobe clustered perilymphatic and perifissural nodules consistent with sarcoidosis.  The findings are stable.   Review of Systems A 10 point review of systems was performed and it is as noted above otherwise negative.  Patient Active Problem List   Diagnosis Date Noted  . Sarcoidosis 08/04/2019  . Acute pain of left shoulder 08/04/2019  . Essential hypertension 04/06/2019  . Asthma 04/06/2019  . Hyperlipidemia 04/06/2019   No Known Allergies  Current Meds  Medication Sig  . albuterol (PROVENTIL) (2.5 MG/3ML) 0.083% nebulizer solution Take 3  mLs (2.5 mg total) by nebulization every 4 (four) hours as needed for wheezing or shortness of breath.  Marland Kitchen albuterol (VENTOLIN HFA) 108 (90 Base) MCG/ACT inhaler Inhale 2 puffs into the lungs every 6 (six) hours as needed for wheezing or shortness of breath.  Marland Kitchen amLODipine (NORVASC) 5 MG tablet TAKE 1 TABLET BY MOUTH DAILY.  . fluticasone-salmeterol (ADVAIR HFA) 115-21 MCG/ACT inhaler Inhale 2 puffs into the lungs 2 (two) times daily.  . Nebulizers (COMPRESSOR/NEBULIZER) MISC 1 Units by Does not apply route every 4 (four) hours as needed.   Immunization History  Administered Date(s) Administered  . Influenza-Unspecified 12/08/2018, 12/15/2019  . PFIZER SARS-COV-2 Vaccination 03/11/2019, 04/01/2019  . Tdap 06/03/2005  . Zoster Recombinat (Shingrix) 11/09/2019       Objective:   Physical Exam BP 126/72 (BP Location: Left Arm, Cuff Size: Normal)   Pulse 77   Temp (!) 97.3 F (36.3 C) (Temporal)   Ht 5\' 4"  (1.626 m)   Wt 191 lb (86.6 kg)   SpO2 98%   BMI 32.79 kg/m  GENERAL: Awake, alert, fully ambulatory.  No respiratory distress. HEAD: Normocephalic, atraumatic.  EYES: Pupils equal, round, reactive to light.  No scleral icterus.  MOUTH: Nose/mouth/throat not examined due to masking requirements for COVID 19. NECK: Supple. No thyromegaly. Trachea midline. No JVD.  No adenopathy. PULMONARY: Lungs clear to auscultation bilaterally.  Good air entry bilaterally. CARDIOVASCULAR: S1 and S2. Regular rate and rhythm.  No rubs murmurs or gallops heard. GASTROINTESTINAL: No abdominal distention noted. MUSCULOSKELETAL: No joint deformity, no clubbing, no edema.  NEUROLOGIC: Awake, alert, no focal deficits noted.  Fluent speech, no gait disturbance noted. SKIN: Intact,warm,dry.  Limited exam shows no rashes. PSYCH: Mood and behavior normal.     Assessment & Plan:     ICD-10-CM   1. Moderate persistent asthma with (acute) exacerbation  J45.41    Well compensated on current inhaler therapy    2. Sarcoidosis  D86.9    CT scan of the chest in 2 days We will call results of CT  3. Shortness of breath  R06.02    Stable to improved   Discussion:  Patient has moderate persistent asthma and appears to be well compensated in this regard.  She has been instructed to continue Advair HFA 115/21, 2 puffs twice a day and as needed albuterol.  We are to see her in follow-up in 4 months time she is to contact us prior to that time should any new difficulties arise.  Gailen Shelter, MD [facility]   *This note was dictated using voice recognition software/Dragon.  Despite best efforts to proofread, errors can occur which can change the meaning.  Any change was purely unintentional.

## 2020-01-18 ENCOUNTER — Ambulatory Visit (INDEPENDENT_AMBULATORY_CARE_PROVIDER_SITE_OTHER): Payer: 59

## 2020-01-18 DIAGNOSIS — Z23 Encounter for immunization: Secondary | ICD-10-CM

## 2020-01-18 NOTE — Progress Notes (Signed)
Per orders of Dr. Selena Batten, injection of Shingrix, given by Selina Cooley, RN. Patient tolerated injection well in R Deltoid.

## 2020-01-19 ENCOUNTER — Ambulatory Visit
Admission: RE | Admit: 2020-01-19 | Discharge: 2020-01-19 | Disposition: A | Discharge status: Home / Self Care | Payer: 59 | Source: Ambulatory Visit | Attending: Pulmonary Disease | Admitting: Pulmonary Disease

## 2020-01-19 ENCOUNTER — Other Ambulatory Visit: Payer: Self-pay

## 2020-01-19 DIAGNOSIS — D869 Sarcoidosis, unspecified: Secondary | ICD-10-CM

## 2020-01-19 DIAGNOSIS — D860 Sarcoidosis of lung: Secondary | ICD-10-CM | POA: Diagnosis not present

## 2020-02-07 ENCOUNTER — Other Ambulatory Visit: Payer: Self-pay | Admitting: Internal Medicine

## 2020-02-07 ENCOUNTER — Ambulatory Visit: Payer: 59 | Attending: Internal Medicine

## 2020-02-07 DIAGNOSIS — Z23 Encounter for immunization: Secondary | ICD-10-CM

## 2020-05-21 ENCOUNTER — Ambulatory Visit: Payer: 59 | Admitting: Pulmonary Disease

## 2020-05-21 ENCOUNTER — Encounter: Payer: Self-pay | Admitting: Pulmonary Disease

## 2020-05-21 ENCOUNTER — Other Ambulatory Visit: Payer: Self-pay

## 2020-05-21 VITALS — BP 122/80 | HR 77 | Temp 97.3°F | Ht 64.0 in | Wt 191.2 lb

## 2020-05-21 DIAGNOSIS — J4541 Moderate persistent asthma with (acute) exacerbation: Secondary | ICD-10-CM | POA: Diagnosis not present

## 2020-05-21 DIAGNOSIS — D869 Sarcoidosis, unspecified: Secondary | ICD-10-CM | POA: Diagnosis not present

## 2020-05-21 NOTE — Patient Instructions (Signed)
Continue Advair.  We will see you in follow-up in 4 months time call sooner should any new problems arise.

## 2020-05-21 NOTE — Progress Notes (Signed)
Subjective:    Patient ID: Meghan Leon, female    DOB: 11/29/1955, 65 y.o.   MRN: [id number]  HPI Meghan Leon is a 65 year old lifelong never smoker with a history of moderate persistent asthma and sarcoidosis.  She follows here for the same.  This is a scheduled visit.  Last visit here was on 17 January 2020.  Subsequently after that she had a CT scan of the chest that showed persistent changes of sarcoidosis.  She is asymptomatic in this regard.  She is compliant with Advair.  She has not had any fevers, chills or sweats.  No shortness of breath.  No cough or sputum production.  No hemoptysis.  No major issues with allergy season.  Has not needed rescue albuterol in several months.  She feels well and looks well.  DATA: 1June 2021 PFTs: FEV1 1.19 L or 60% predicted, FVC 1.78 L or 70% predicted. Postbronchodilator there is a 21% net change on FEV1 to 1.45 L or 73% predicted and equally on 19% change in FVC demonstrating decreased air trapping. Lung volumes are mildly reduced consistent with the patient's sarcoidosis. Diffusion capacity mildly reduced when alveolar volume is taken into account. 19 July 2019 CT scan of the chest:  shows calcified mediastinal lymphadenopathy consistent with sarcoidosis and fibrotic changes on the right upper lobe clustered perilymphatic and perifissural nodules consistent with sarcoidosis. The findings are stable. 19 January 2020 CT scan of the chest: Changes of sarcoidosis with one area slightly enlarged however still consistent with sarcoidosis.  Review of Systems A 10 point review of systems was performed and it is as noted above otherwise negative  Patient Active Problem List   Diagnosis Date Noted  . Sarcoidosis 08/04/2019  . Acute pain of left shoulder 08/04/2019  . Essential hypertension 04/06/2019  . Asthma 04/06/2019  . Hyperlipidemia 04/06/2019   Not on File  Current Meds  Medication Sig  . albuterol (PROVENTIL) (2.5 MG/3ML) 0.083%  nebulizer solution Take 3 mLs (2.5 mg total) by nebulization every 4 (four) hours as needed for wheezing or shortness of breath.  Marland Kitchen albuterol (VENTOLIN HFA) 108 (90 Base) MCG/ACT inhaler Inhale 2 puffs into the lungs every 6 (six) hours as needed for wheezing or shortness of breath.  Marland Kitchen amLODipine (NORVASC) 5 MG tablet TAKE 1 TABLET BY MOUTH DAILY.  Marland Kitchen COVID-19 mRNA vaccine, Pfizer, 30 MCG/0.3ML injection USE AS DIRECTED  . fluticasone-salmeterol (ADVAIR HFA) 115-21 MCG/ACT inhaler INHALE 2 PUFFS INTO THE LUNGS 2 TIMES DAILY.  . Nebulizers (COMPRESSOR/NEBULIZER) MISC 1 Units by Does not apply route every 4 (four) hours as needed.   Immunization History  Administered Date(s) Administered  . Influenza-Unspecified 12/08/2018, 12/15/2019  . PFIZER(Purple Top)SARS-COV-2 Vaccination 03/11/2019, 04/01/2019, 02/07/2020  . Tdap 06/03/2005  . Zoster Recombinat (Shingrix) 11/09/2019, 01/18/2020    Objective:   Physical Exam BP 122/80 (BP Location: Left Arm, Cuff Size: Normal)   Pulse 77   Temp (!) 97.3 F (36.3 C) (Temporal)   Ht 5\' 4"  (1.626 m)   Wt 191 lb 3.2 oz (86.7 kg)   SpO2 99%   BMI 32.82 kg/m   GENERAL: Awake, alert, fully ambulatory. No respiratory distress.  No conversational dyspnea. HEAD: Normocephalic, atraumatic.  EYES: Pupils equal, round, reactive to light. No scleral icterus.  MOUTH: Nose/mouth/throat not examined due to masking requirements for COVID 19. NECK: Supple. No thyromegaly. Trachea midline. No JVD. No adenopathy. PULMONARY:Good air entry bilaterally.  Very mild end expiratory wheezes noted. CARDIOVASCULAR: S1 and S2. Regular rate and  rhythm. No rubs murmurs or gallops heard. GASTROINTESTINAL: No abdominal distention noted. MUSCULOSKELETAL: No joint deformity, no clubbing, no edema.  NEUROLOGIC: No focal deficit, no gait disturbance, speech is fluent. SKIN: Intact,warm,dry. Limited exam shows no rashes. PSYCH: Mood and behavior normal   Representative  image of CT performed 19 January 2020, independently reviewed:      Assessment & Plan:     ICD-10-CM   1. Moderate persistent asthma with (acute) exacerbation  J45.41    Continue Advair 115/21, 2 puffs twice a day Continue as needed albuterol  2. Sarcoidosis  D86.9    We will see her in follow-up in 4 months time Hold off on CT today schedule CT on follow-up appointment   Patient is doing well.  We will see her in follow-up in 4 months time.  She is to contact us prior to that time should any new difficulties arise.   Gailen Shelter, MD [facility]   *This note was dictated using voice recognition software/Dragon.  Despite best efforts to proofread, errors can occur which can change the meaning.  Any change was purely unintentional.

## 2020-06-26 ENCOUNTER — Other Ambulatory Visit: Payer: Self-pay

## 2020-06-26 ENCOUNTER — Other Ambulatory Visit: Payer: Self-pay | Admitting: Family Medicine

## 2020-06-26 MED ORDER — AMLODIPINE BESYLATE 5 MG PO TABS
ORAL_TABLET | Freq: Every day | ORAL | 3 refills | Status: DC
  Filled 2020-06-26: qty 90, 90d supply, fill #0
  Filled 2020-10-01: qty 90, 90d supply, fill #1
  Filled 2021-01-08: qty 90, 90d supply, fill #2
  Filled 2021-04-08: qty 90, 90d supply, fill #3

## 2020-07-20 ENCOUNTER — Other Ambulatory Visit: Payer: Self-pay | Admitting: Pulmonary Disease

## 2020-07-20 ENCOUNTER — Other Ambulatory Visit: Payer: Self-pay

## 2020-07-20 MED ORDER — ADVAIR HFA 115-21 MCG/ACT IN AERO
2.0000 | INHALATION_SPRAY | Freq: Two times a day (BID) | RESPIRATORY_TRACT | 12 refills | Status: DC
  Filled 2020-07-20: qty 12, 30d supply, fill #0

## 2020-07-23 ENCOUNTER — Other Ambulatory Visit: Payer: Self-pay

## 2020-08-10 ENCOUNTER — Ambulatory Visit
Admission: RE | Admit: 2020-08-10 | Discharge: 2020-08-10 | Disposition: A | Discharge status: Home / Self Care | Payer: 59 | Source: Ambulatory Visit | Attending: Pulmonary Disease | Admitting: Pulmonary Disease

## 2020-08-10 ENCOUNTER — Other Ambulatory Visit: Payer: Self-pay

## 2020-08-10 DIAGNOSIS — D869 Sarcoidosis, unspecified: Secondary | ICD-10-CM | POA: Diagnosis not present

## 2020-08-10 DIAGNOSIS — R590 Localized enlarged lymph nodes: Secondary | ICD-10-CM | POA: Diagnosis not present

## 2020-08-10 DIAGNOSIS — R918 Other nonspecific abnormal finding of lung field: Secondary | ICD-10-CM | POA: Diagnosis not present

## 2020-09-19 ENCOUNTER — Other Ambulatory Visit
Admission: RE | Admit: 2020-09-19 | Discharge: 2020-09-19 | Disposition: A | Discharge status: Home / Self Care | Payer: 59 | Attending: Pulmonary Disease | Admitting: Pulmonary Disease

## 2020-09-19 ENCOUNTER — Encounter: Payer: Self-pay | Admitting: Pulmonary Disease

## 2020-09-19 ENCOUNTER — Other Ambulatory Visit: Payer: Self-pay

## 2020-09-19 ENCOUNTER — Ambulatory Visit: Payer: 59 | Admitting: Pulmonary Disease

## 2020-09-19 VITALS — BP 122/70 | HR 95 | Temp 97.1°F | Ht 64.0 in | Wt 185.2 lb

## 2020-09-19 DIAGNOSIS — Z91018 Allergy to other foods: Secondary | ICD-10-CM

## 2020-09-19 DIAGNOSIS — J4541 Moderate persistent asthma with (acute) exacerbation: Secondary | ICD-10-CM | POA: Insufficient documentation

## 2020-09-19 DIAGNOSIS — D869 Sarcoidosis, unspecified: Secondary | ICD-10-CM

## 2020-09-19 DIAGNOSIS — J0141 Acute recurrent pansinusitis: Secondary | ICD-10-CM

## 2020-09-19 LAB — CBC WITH DIFFERENTIAL/PLATELET
Abs Immature Granulocytes: 0.02 10*3/uL (ref 0.00–0.07)
Basophils Absolute: 0.1 10*3/uL (ref 0.0–0.1)
Basophils Relative: 1 %
Eosinophils Absolute: 0.4 10*3/uL (ref 0.0–0.5)
Eosinophils Relative: 5 %
HCT: 43.3 % (ref 36.0–46.0)
Hemoglobin: 13.9 g/dL (ref 12.0–15.0)
Immature Granulocytes: 0 %
Lymphocytes Relative: 36 %
Lymphs Abs: 3.3 10*3/uL (ref 0.7–4.0)
MCH: 25.5 pg — ABNORMAL LOW (ref 26.0–34.0)
MCHC: 32.1 g/dL (ref 30.0–36.0)
MCV: 79.3 fL — ABNORMAL LOW (ref 80.0–100.0)
Monocytes Absolute: 0.6 10*3/uL (ref 0.1–1.0)
Monocytes Relative: 7 %
Neutro Abs: 4.6 10*3/uL (ref 1.7–7.7)
Neutrophils Relative %: 51 %
Platelets: 324 10*3/uL (ref 150–400)
RBC: 5.46 MIL/uL — ABNORMAL HIGH (ref 3.87–5.11)
RDW: 15.4 % (ref 11.5–15.5)
WBC: 9 10*3/uL (ref 4.0–10.5)
nRBC: 0 % (ref 0.0–0.2)

## 2020-09-19 MED ORDER — BREZTRI AEROSPHERE 160-9-4.8 MCG/ACT IN AERO: 2.0000 | INHALATION_SPRAY | Freq: Two times a day (BID) | RESPIRATORY_TRACT | 0 refills | Status: DC

## 2020-09-19 NOTE — Progress Notes (Signed)
Subjective:    Patient ID: Meghan Leon, female    DOB: 12/03/1955, 65 y.o.   MRN: [id number] Chief Complaint  Patient presents with   Follow-up    Doing well. No current sx.   HPI Meghan Leon is a 65 year old lifelong never smoker with a history of moderate persistent asthma and sarcoidosis.  She follows here for the same.  This is a scheduled visit.  Last visit here was on 21 May 2020, she was doing well at that time.  Subsequently after that she had a CT scan of the chest that showed persistent changes of sarcoidosis.  She is asymptomatic in this regard.  She is compliant with Advair but does not feel that this helps her completely.  Recently also she lost coverage for the Advair.  She cannot tolerate powdered preparations.  She has not had any fevers, chills or sweats.  No shortness of breath.  He has had cough but no sputum production.  No hemoptysis.  She has recently needed to use albuterol more often several times a week.  Currently she feels that some foods trigger her allergies and shortness of breath.   Overall, she feels well and looks well.  DATA: 19 July 2019 PFTs: FEV1 1.19 L or 60% predicted, FVC 1.78 L or 70% predicted.  Postbronchodilator there is a 21% net change on FEV1 to 1.45 L or 73% predicted and equally on 19% change in FVC demonstrating decreased air trapping.  Lung volumes are mildly reduced consistent with the patient's sarcoidosis.  Diffusion capacity mildly reduced when alveolar volume is taken into account. 19 July 2019 CT scan of the chest:  shows calcified mediastinal lymphadenopathy consistent with sarcoidosis and fibrotic changes on the right upper lobe clustered perilymphatic and perifissural nodules consistent with sarcoidosis.  The findings are stable. 19 January 2020 CT scan of the chest: Changes of sarcoidosis with one area slightly enlarged however still consistent with sarcoidosis. 08/10/2020 CT chest without contrast: Persistent changes consistent with  sarcoidosis.  Review of Systems A 10 point review of systems was performed and it is as noted above otherwise negative.  Patient Active Problem List   Diagnosis Date Noted   Sarcoidosis 08/04/2019   Acute pain of left shoulder 08/04/2019   Essential hypertension 04/06/2019   Asthma 04/06/2019   Hyperlipidemia 04/06/2019   Social History   Tobacco Use   Smoking status: Never   Smokeless tobacco: Never   Tobacco comments:    never  Substance Use Topics   Alcohol use: No   No Known Allergies  Current Meds  Medication Sig   albuterol (PROVENTIL) (2.5 MG/3ML) 0.083% nebulizer solution Take 3 mLs (2.5 mg total) by nebulization every 4 (four) hours as needed for wheezing or shortness of breath.   albuterol (VENTOLIN HFA) 108 (90 Base) MCG/ACT inhaler Inhale 2 puffs into the lungs every 6 (six) hours as needed for wheezing or shortness of breath.   amLODipine (NORVASC) 5 MG tablet TAKE 1 TABLET BY MOUTH DAILY.   COVID-19 mRNA vaccine, Pfizer, 30 MCG/0.3ML injection USE AS DIRECTED   fluticasone-salmeterol (ADVAIR HFA) 115-21 MCG/ACT inhaler INHALE 2 PUFFS INTO THE LUNGS 2 TIMES DAILY.   Nebulizers (COMPRESSOR/NEBULIZER) MISC 1 Units by Does not apply route every 4 (four) hours as needed.    Immunization History  Administered Date(s) Administered   Influenza-Unspecified 12/08/2018, 12/15/2019   PFIZER(Purple Top)SARS-COV-2 Vaccination 03/11/2019, 04/01/2019, 02/07/2020   Tdap 06/03/2005   Zoster Recombinat (Shingrix) 11/09/2019, 01/18/2020  Objective:   Physical Exam BP 122/70 (BP Location: Left Arm, Cuff Size: Normal)   Pulse 95   Temp (!) 97.1 F (36.2 C) (Temporal)   Ht 5\' 4"  (1.626 m)   Wt 185 lb 3.2 oz (84 kg)   SpO2 97%   BMI 31.79 kg/m   GENERAL: Awake, alert, fully ambulatory.  No respiratory distress.  No conversational dyspnea. HEAD: Normocephalic, atraumatic.  EYES: Pupils equal, round, reactive to light.  No scleral icterus.  MOUTH:  Nose/mouth/throat not examined due to masking requirements for COVID 19. NECK: Supple. No thyromegaly. Trachea midline. No JVD.  No adenopathy. PULMONARY: Good air entry bilaterally.  Very mild end expiratory wheezes noted. CARDIOVASCULAR: S1 and S2. Regular rate and rhythm.  No rubs murmurs or gallops heard. GASTROINTESTINAL: No abdominal distention noted. MUSCULOSKELETAL: No joint deformity, no clubbing, no edema.  NEUROLOGIC: No focal deficit, no gait disturbance, speech is fluent. SKIN: Intact,warm,dry.  Limited exam shows no rashes. PSYCH: Mood and behavior normal   Representative slides from CT performed August 10, 2020, independently reviewed:  Changes consistent with sarcoidosis unchanged from prior.    Assessment & Plan:     ICD-10-CM   1. Moderate persistent asthma with (acute) exacerbation  J45.41 CBC w/Diff    Food Allergy Profile   Very mild exacerbation Does not appear to have associated infection Advair not effective Trial of Breztri    2. Sarcoidosis  D86.9    Stable by CT chest    3. Food allergy  Z91.018    Have asked patient to keep food diary Will perform RAST directed to foods May need referral to allergy and immunology      Meds ordered this encounter  Medications   Budeson-Glycopyrrol-Formoterol (BREZTRI AEROSPHERE) 160-9-4.8 MCG/ACT AERO    Sig: Inhale 2 puffs into the lungs in the morning and at bedtime.    Dispense:  5.9 g    Refill:  0    Order Specific Question:   Lot Number?    Answer:   August 12, 2020 D00    Order Specific Question:   Expiration Date?    Answer:   01/18/2023    Order Specific Question:   Manufacturer?    Answer:   [employer] [[id number]]    Order Specific Question:   Quantity    Answer:   2    Patient has moderate persistent asthma that is not responding to Advair and in addition Advair is now not being covered by her insurance company.  Neither is Symbicort.  Patient does have some COPD features associated with asthma due to airway  remodeling.  It is worthwhile to give a trial to [place] in this instance.  I have provided her with samples.  She is to let [place] know if the medication is effective so we can send the prescription into her pharmacy.  We will see her in follow-up in 3 months time call sooner should any new problems arise.  [place], MD [facility]    *This note was dictated using voice recognition software/Dragon.  Despite best efforts to proofread, errors can occur which can change the meaning.  Any change was purely unintentional.

## 2020-09-19 NOTE — Patient Instructions (Signed)
We are giving a trial of Breztri 2 puffs twice a day.  Make sure you rinse your mouth well after you use it  DO NOT USE ADVAIR WHILE USING THE BREZTRI.  We are getting a blood test to check for food allergies.  We will see you in follow-up in 3 months time call sooner should any new problems arise.

## 2020-09-23 LAB — MISC LABCORP TEST (SEND OUT): Labcorp test code: 602989

## 2020-10-01 ENCOUNTER — Other Ambulatory Visit: Payer: Self-pay

## 2020-10-03 ENCOUNTER — Other Ambulatory Visit: Payer: Self-pay

## 2020-10-04 ENCOUNTER — Telehealth: Payer: Self-pay

## 2020-10-04 ENCOUNTER — Other Ambulatory Visit: Payer: Self-pay

## 2020-10-04 DIAGNOSIS — J4520 Mild intermittent asthma, uncomplicated: Secondary | ICD-10-CM

## 2020-10-04 MED ORDER — BREZTRI AEROSPHERE 160-9-4.8 MCG/ACT IN AERO
160.0000 ug | INHALATION_SPRAY | Freq: Two times a day (BID) | RESPIRATORY_TRACT | 11 refills | Status: DC
  Filled 2020-10-04: qty 10.7, 30d supply, fill #0
  Filled 2020-12-05: qty 10.7, 30d supply, fill #1
  Filled 2021-02-14: qty 10.7, 30d supply, fill #2
  Filled 2021-05-14: qty 10.7, 30d supply, fill #3
  Filled 2021-08-06: qty 10.7, 30d supply, fill #4

## 2020-10-05 NOTE — Telephone Encounter (Signed)
Created in error

## 2020-10-11 DIAGNOSIS — H524 Presbyopia: Secondary | ICD-10-CM | POA: Diagnosis not present

## 2020-10-11 DIAGNOSIS — H52223 Regular astigmatism, bilateral: Secondary | ICD-10-CM | POA: Diagnosis not present

## 2020-10-11 DIAGNOSIS — H5213 Myopia, bilateral: Secondary | ICD-10-CM | POA: Diagnosis not present

## 2020-10-23 ENCOUNTER — Telehealth (INDEPENDENT_AMBULATORY_CARE_PROVIDER_SITE_OTHER): Payer: 59 | Admitting: Adult Health

## 2020-10-23 ENCOUNTER — Other Ambulatory Visit: Payer: Self-pay

## 2020-10-23 ENCOUNTER — Encounter: Payer: Self-pay | Admitting: Adult Health

## 2020-10-23 ENCOUNTER — Telehealth: Payer: Self-pay | Admitting: Pulmonary Disease

## 2020-10-23 DIAGNOSIS — U071 COVID-19: Secondary | ICD-10-CM | POA: Diagnosis not present

## 2020-10-23 DIAGNOSIS — J4541 Moderate persistent asthma with (acute) exacerbation: Secondary | ICD-10-CM | POA: Diagnosis not present

## 2020-10-23 MED ORDER — MOLNUPIRAVIR EUA 200MG CAPSULE
4.0000 | ORAL_CAPSULE | Freq: Two times a day (BID) | ORAL | 0 refills | Status: AC
  Filled 2020-10-23: qty 40, 5d supply, fill #0

## 2020-10-23 NOTE — Patient Instructions (Signed)
Begin Molnupiravir Pack for 5 days .  Continue on Breztri inhaler twice daily Albuterol inhaler as needed Mucinex DM twice daily as needed for cough and congestion Tylenol as needed Fluids and rest You need to quarantine as discussed. Follow-up with Dr. Jayme Cloud in 6 to 8 weeks and as needed  Please contact office for sooner follow up if symptoms do not improve or worsen or seek emergency care

## 2020-10-23 NOTE — Telephone Encounter (Signed)
Appt scheduled for 10/23/2020 at 1:45. Patient is aware and voiced her understanding.  Nothing further needed.

## 2020-10-23 NOTE — Telephone Encounter (Signed)
Dr. Jayme Cloud patient last seen 09/19/2020 for asthma.  Spoke to patient, who stated that she tested positive for covid on 10/21/20. Sx started that day.  C/o sinus congestion, dry cough, wheezing, chills, sweats, mild body aches and low grade of 99.  Sob is baseline. She has not used albuterol. Rx has expired.  She stopped Breztri yesterday as she read that it inhibits the ability to fight infection. Alternating Tylenol and Ibuprofen q4h. No supplemental oxygen. Not monitoring spo2.  Two covid vaccines and one booster.   Tammy, please advise. Dr. Jayme Cloud is unavailable.

## 2020-10-23 NOTE — Progress Notes (Signed)
Virtual Visit via Video Note  I connected with Meghan Leon on 10/23/20 at  2:00 PM EDT by a video enabled telemedicine application and verified that I am speaking with the correct person using two identifiers.  Location: Patient: Home  Provider: Office    I discussed the limitations of evaluation and management by telemedicine and the availability of in person appointments. The patient expressed understanding and agreed to proceed.  History of Present Illness: 65 year old female followed for moderate persistent asthma, sarcoidosis  Today's video visit is an acute office visit.  Patient has tested positive for COVID-19 9/4 . Symptoms began 9/4.  Had direct contact with husband who tested positive last week. Has nasal congestion and cough . Felt feverish and body aches initially . Feels some better today .  She has Asthma. Remains on BREZTRI Twice daily  . No increased wheezing or albuterol use.  Taking tylenol and advil .  Vaccinated x 3 for Covid 19.  No chest pain, hemoptysis , orthopena or edema.   Past Medical History:  Diagnosis Date   Allergy 15 years ago   chemical exposure   Asthma    Hypertension January 2021   ED visit   Medical history non-contributory    Current Outpatient Medications on File Prior to Visit  Medication Sig Dispense Refill   albuterol (PROVENTIL) (2.5 MG/3ML) 0.083% nebulizer solution Take 3 mLs (2.5 mg total) by nebulization every 4 (four) hours as needed for wheezing or shortness of breath. 75 mL 0   albuterol (VENTOLIN HFA) 108 (90 Base) MCG/ACT inhaler Inhale 2 puffs into the lungs every 6 (six) hours as needed for wheezing or shortness of breath. 18 g 3   amLODipine (NORVASC) 5 MG tablet TAKE 1 TABLET BY MOUTH DAILY. 90 tablet 3   Budeson-Glycopyrrol-Formoterol (BREZTRI AEROSPHERE) 160-9-4.8 MCG/ACT AERO Inhale 2 puffs into the lungs in the morning and at bedtime. 5.9 g 0   Budeson-Glycopyrrol-Formoterol (BREZTRI AEROSPHERE) 160-9-4.8 MCG/ACT  AERO Inhale 160 mcg into the lungs in the morning and at bedtime. 10.7 g 11   COVID-19 mRNA vaccine, Pfizer, 30 MCG/0.3ML injection USE AS DIRECTED .3 mL 0   Nebulizers (COMPRESSOR/NEBULIZER) MISC 1 Units by Does not apply route every 4 (four) hours as needed. 1 each 0   No current facility-administered medications on file prior to visit.      Observations/Objective: Appears in NAD, speaks in full sentences. No accesssory muscle use.    Assessment and Plan: Covid 19 Infection. Her age along with Asthma increase her risk for disease progression of Covid 47 . Also only 1 booster vaccine .  We discussed antiviral therapy . No recent bmet and she is on BREZTRI (do not want to stop therapy for Paxlovid ) , will use Molnupiriavir .   Moderate persistent asthma.  Continue on current regimen.  Hold on steroids at this time.   Plan  Patient Instructions  Begin Molnupiravir Pack for 5 days .  Continue on Breztri inhaler twice daily Albuterol inhaler as needed Mucinex DM twice daily as needed for cough and congestion Tylenol as needed Fluids and rest You need to quarantine as discussed. Follow-up with Dr. Jayme Cloud in 6 to 8 weeks and as needed  Please contact office for sooner follow up if symptoms do not improve or worsen or seek emergency care       Follow Up Instructions:    I discussed the assessment and treatment plan with the patient. The patient was provided an opportunity to ask  questions and all were answered. The patient agreed with the plan and demonstrated an understanding of the instructions.   The patient was advised to call back or seek an in-person evaluation if the symptoms worsen or if the condition fails to improve as anticipated.  I provided 31  minutes of non-face-to-face time during this encounter.   Rubye Oaks, NP

## 2020-10-23 NOTE — Telephone Encounter (Signed)
[name]  P [facility] (supporting Salena Saner, MD) Yesterday (2:08 AM)   My entire household has Covid. My daughter and I tested positive yesterday and my husband last week Thursday.  I stopped [facility] medicine yesterday as it inhibits ability to fight infection.  I don't feel too bad or have difficulty breathing right now. I have sinus pain and cough. I'm taking alternating Tylenol and Ibuprofen. Any other suggestions?  Lm for patient.

## 2020-10-23 NOTE — Telephone Encounter (Signed)
Can set up for video visit today at 1:45 (double book in 2 pm slot )  Can evaluate for antiviral therapy .  Please contact office for sooner follow up if symptoms do not improve or worsen or seek emergency care

## 2020-10-23 NOTE — Telephone Encounter (Signed)
Patient is returning phone call. Patient phone number is [phone].

## 2020-10-29 ENCOUNTER — Encounter: Payer: Self-pay | Admitting: Pulmonary Disease

## 2020-10-29 NOTE — Progress Notes (Signed)
Agree with the details of the visit as noted by Tammy Parrett, NP.  C. Laura Corianne Buccellato, MD [facility] 

## 2020-11-13 ENCOUNTER — Ambulatory Visit (INDEPENDENT_AMBULATORY_CARE_PROVIDER_SITE_OTHER): Payer: 59 | Admitting: Family Medicine

## 2020-11-13 ENCOUNTER — Other Ambulatory Visit: Payer: Self-pay

## 2020-11-13 VITALS — BP 128/78 | HR 84 | Temp 97.0°F | Ht 63.25 in | Wt 182.4 lb

## 2020-11-13 DIAGNOSIS — Z23 Encounter for immunization: Secondary | ICD-10-CM

## 2020-11-13 DIAGNOSIS — E782 Mixed hyperlipidemia: Secondary | ICD-10-CM | POA: Diagnosis not present

## 2020-11-13 DIAGNOSIS — I10 Essential (primary) hypertension: Secondary | ICD-10-CM

## 2020-11-13 DIAGNOSIS — Z0000 Encounter for general adult medical examination without abnormal findings: Secondary | ICD-10-CM

## 2020-11-13 LAB — LIPID PANEL
Cholesterol: 225 mg/dL — ABNORMAL HIGH (ref 0–200)
HDL: 41.3 mg/dL (ref >39.00)
LDL Cholesterol: 158 mg/dL — ABNORMAL HIGH (ref 0–99)
NonHDL: 184.08
Total CHOL/HDL Ratio: 5
Triglycerides: 132 mg/dL (ref 0.0–149.0)
VLDL: 26.4 mg/dL (ref 0.0–40.0)

## 2020-11-13 LAB — COMPREHENSIVE METABOLIC PANEL
ALT: 12 U/L (ref 0–35)
AST: 14 U/L (ref 0–37)
Albumin: 4 g/dL (ref 3.5–5.2)
Alkaline Phosphatase: 111 U/L (ref 39–117)
BUN: 10 mg/dL (ref 6–23)
CO2: 30 mEq/L (ref 19–32)
Calcium: 9.2 mg/dL (ref 8.4–10.5)
Chloride: 104 mEq/L (ref 96–112)
Creatinine, Ser: 1.01 mg/dL (ref 0.40–1.20)
GFR: 58.5 mL/min — ABNORMAL LOW (ref >60.00)
Glucose, Bld: 92 mg/dL (ref 70–99)
Potassium: 3.8 mEq/L (ref 3.5–5.1)
Sodium: 140 mEq/L (ref 135–145)
Total Bilirubin: 0.4 mg/dL (ref 0.2–1.2)
Total Protein: 7.6 g/dL (ref 6.0–8.3)

## 2020-11-13 NOTE — Assessment & Plan Note (Signed)
Discussed risk of 9.9% and increasing with age. She declined medication at this time. Work on diet/exercise

## 2020-11-13 NOTE — Progress Notes (Signed)
Annual Exam   Chief Complaint:  Chief Complaint  Patient presents with   Annual Exam    No concerns     History of Present Illness:  Ms. Meghan Leon is a 65 y.o. No obstetric history on file. who LMP was No LMP recorded. Patient has had a hysterectomy., presents today for her annual examination.     Nutrition She does get adequate calcium and Vitamin D in her diet. Diet: yogurt, generally healthy Exercise: hiking and walking - getting back into regular exercise. Summer - Administrator, Civil Service The patient wears seatbelts: yes.     The patient feels safe at home and in their relationships: yes.  Dentist: yes Eye: yes  Menstrual:  Symptoms of menopause: none  GYN She is single partner, contraception - post menopausal status.    Cervical Cancer Screening (21-65):   Last Pap: 01/2015 - no longer has cervix  Breast Cancer Screening (Age 12-74):  There is no FH of breast cancer. There is no FH of ovarian cancer. BRCA screening Not Indicated.  Last Mammogram: 11/2019 The patient does want a mammogram this year.    Colon Cancer Screening:  Age 80-75 yo - benefits outweigh the risk. Adults 11-85 yo who have never been screened benefit.  Benefits: 134000 people in 2016 will be diagnosed and 49,000 will die - early detection helps [place]: Complications 2/2 to colonoscopy High Risk (Colonoscopy): genetic disorder (Lynch syndrome or familial adenomatous polyposis), personal hx of IBD, previous adenomatous polyp, or previous colorectal cancer, FamHx start 10 years before the age at diagnosis, increased in males and black race  Options:  FIT - looks for hemoglobin (blood in the stool) - specific and fairly sensitive - must be done annually Cologuard - looks for DNA and blood - more sensitive - therefore can have more false positives, every 3 years Colonoscopy - every 10 years if normal - sedation, bowl prep, must have someone drive you  Shared decision making and the patient had  decided to do colonoscopy - due 2027.   Social History   Tobacco Use  Smoking Status Never  Smokeless Tobacco Never  Tobacco Comments   never    Lung Cancer Screening (Ages 08-14): not applicable   Weight Wt Readings from Last 3 Encounters:  11/13/20 182 lb 6 oz (82.7 kg)  09/19/20 185 lb 3.2 oz (84 kg)  05/21/20 191 lb 3.2 oz (86.7 kg)   Patient has high BMI  BMI Readings from Last 1 Encounters:  11/13/20 32.05 kg/m     Chronic disease screening Blood pressure monitoring:  BP Readings from Last 3 Encounters:  11/13/20 128/78  09/19/20 122/70  05/21/20 122/80    Lipid Monitoring: Indication for screening: age >28, obesity, diabetes, family hx, CV risk factors.  Lipid screening: Yes  Lab Results  Component Value Date   CHOL 205 (H) 08/04/2019   HDL 42.60 08/04/2019   LDLCALC 133 (H) 08/04/2019   TRIG 147.0 08/04/2019   CHOLHDL 5 08/04/2019     Diabetes Screening: age >57, overweight, family hx, PCOS, hx of gestational diabetes, at risk ethnicity Diabetes Screening screening: Yes  No results found for: HGBA1C   Past Medical History:  Diagnosis Date   Allergy 15 years ago   chemical exposure   Asthma    Hypertension January 2021   ED visit   Medical history non-contributory     Past Surgical History:  Procedure Laterality Date   ABDOMINAL HYSTERECTOMY  Age 12   CESAREAN SECTION  COLONOSCOPY WITH PROPOFOL N/A 04/13/2015   Procedure: COLONOSCOPY WITH PROPOFOL;  Surgeon: Lollie Sails, MD;  Location: [facility] ENDOSCOPY;  Service: Endoscopy;  Laterality: N/A;    Prior to Admission medications   Medication Sig Start Date End Date Taking? Authorizing Provider  albuterol (PROVENTIL) (2.5 MG/3ML) 0.083% nebulizer solution Take 3 mLs (2.5 mg total) by nebulization every 4 (four) hours as needed for wheezing or shortness of breath. 05/24/19  Yes Paulette Blanch, MD  albuterol (VENTOLIN HFA) 108 (90 Base) MCG/ACT inhaler Inhale 2 puffs into the lungs every 6  (six) hours as needed for wheezing or shortness of breath. 03/11/19  Yes Tyler Pita, MD  amLODipine (NORVASC) 5 MG tablet TAKE 1 TABLET BY MOUTH DAILY. 06/26/20 06/26/21 Yes Lesleigh Noe, MD  Budeson-Glycopyrrol-Formoterol (BREZTRI AEROSPHERE) 160-9-4.8 MCG/ACT AERO Inhale 160 mcg into the lungs in the morning and at bedtime. 10/04/20  Yes Tyler Pita, MD  Nebulizers (COMPRESSOR/NEBULIZER) MISC 1 Units by Does not apply route every 4 (four) hours as needed. 05/24/19  Yes Paulette Blanch, MD    No Known Allergies  Gynecologic History: No LMP recorded. Patient has had a hysterectomy.  Obstetric History: No obstetric history on file.  Social History   Socioeconomic History   Marital status: Married    Spouse name: Ray   Number of children: 2   Years of education: college   Highest education level: Not on file  Occupational History   Not on file  Tobacco Use   Smoking status: Never   Smokeless tobacco: Never   Tobacco comments:    never  Vaping Use   Vaping Use: Never used  Substance and Sexual Activity   Alcohol use: No   Drug use: No   Sexual activity: Yes    Birth control/protection: Surgical  Other Topics Concern   Not on file  Social History Narrative   04/06/19   From: the area   Living: with husband Ray, and adult daughter    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      Family: Teacher, music - daughter, grandson [name] 05-04-18) (her daughter passed away)      Enjoys: walking, traveling, [place], cruises       Exercise: walks when the weather is nice   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 belts: Yes    Guns: No   Safe in relationships: Yes    Social Determinants of Radio broadcast assistant Strain: Not on file  Food Insecurity: Not on file  Transportation Needs: Not on file  Physical Activity: Not on file  Stress: Not on file  Social Connections: Not on file  Intimate Partner Violence: Not on file    Family  History  Problem Relation Age of Onset   Breast cancer Daughter 4   Alzheimer's disease Mother    Depression Mother    Hypertension Mother    Alcohol abuse Father    Diabetes Father    Liver cancer Father    Diabetes Brother    Alcohol abuse Brother    Diabetes Sister    Alcohol abuse Sister    Arthritis Sister    COPD Sister    Depression Sister    Drug abuse Sister    Hypertension Sister    Hyperlipidemia Sister    Alcohol abuse Sister    Hyperlipidemia Sister    Hypertension Sister    Diabetes Sister  Hypertension Sister    Hypertension Daughter     Review of Systems  Constitutional:  Negative for chills and fever.  HENT:  Negative for congestion and sore throat.   Eyes:  Negative for blurred vision and double vision.  Respiratory:  Negative for shortness of breath.   Cardiovascular:  Negative for chest pain.  Gastrointestinal:  Negative for heartburn, nausea and vomiting.  Genitourinary: Negative.   Musculoskeletal: Negative.  Negative for myalgias.  Skin:  Negative for rash.  Neurological:  Negative for dizziness and headaches.  Endo/Heme/Allergies:  Does not bruise/bleed easily.  Psychiatric/Behavioral:  Negative for depression. The patient is not nervous/anxious.     Physical Exam BP 128/78   Pulse 84   Temp (!) 97 F (36.1 C) (Temporal)   Ht 5' 3.25" (1.607 m)   Wt 182 lb 6 oz (82.7 kg)   SpO2 98%   BMI 32.05 kg/m    BP Readings from Last 3 Encounters:  11/13/20 128/78  09/19/20 122/70  05/21/20 122/80      Physical Exam Constitutional:      General: She is not in acute distress.    Appearance: She is well-developed. She is not diaphoretic.  HENT:     Head: Normocephalic and atraumatic.     Right Ear: External ear normal.     Left Ear: External ear normal.     Nose: Nose normal.  Eyes:     General: No scleral icterus.    Extraocular Movements: Extraocular movements intact.     Conjunctiva/sclera: Conjunctivae normal.  Cardiovascular:      Rate and Rhythm: Normal rate and regular rhythm.     Heart sounds: No murmur heard. Pulmonary:     Effort: Pulmonary effort is normal. No respiratory distress.     Breath sounds: Normal breath sounds. No wheezing.  Abdominal:     General: Bowel sounds are normal. There is no distension.     Palpations: Abdomen is soft. There is no mass.     Tenderness: There is no abdominal tenderness. There is no guarding or rebound.  Musculoskeletal:        General: Normal range of motion.     Cervical back: Neck supple.  Lymphadenopathy:     Cervical: No cervical adenopathy.  Skin:    General: Skin is warm and dry.     Capillary Refill: Capillary refill takes less than 2 seconds.  Neurological:     Mental Status: She is alert and oriented to person, place, and time.     Deep Tendon Reflexes: Reflexes normal.  Psychiatric:        Mood and Affect: Mood normal.        Behavior: Behavior normal.    Results:  PHQ-9:  Depression screen [facility] 2/9 11/13/2020 11/09/2019 04/06/2019  Decreased Interest 0 0 0  Down, Depressed, Hopeless 0 0 0  PHQ - 2 Score 0 0 0       Assessment: 65 y.o. No obstetric history on file. female here for routine annual physical examination.  Plan: Problem List Items Addressed This Visit       Cardiovascular and Mediastinum   Essential hypertension   Relevant Orders   Comprehensive metabolic panel     Other   Hyperlipidemia    Discussed risk of 9.9% and increasing with age. She declined medication at this time. Work on diet/exercise      Relevant Orders   Lipid panel   Comprehensive metabolic panel   Other Visit Diagnoses  Annual physical exam    -  Primary   Need for Tdap vaccination       Relevant Orders   Tdap vaccine greater than or equal to 7yo IM (Completed)       Screening: -- Blood pressure screen normal -- cholesterol screening: will obtain -- Weight screening: overweight: continue to monitor -- Diabetes Screening: will obtain --  Nutrition: Encouraged healthy diet  The 10-year ASCVD risk score (Arnett DK, et al., 2019) is: 9.9%   Values used to calculate the score:     Age: 52 years     Sex: Female     Is Non-Hispanic African American: Yes     Diabetic: No     Tobacco smoker: No     Systolic Blood Pressure: 312 mmHg     Is BP treated: Yes     HDL Cholesterol: 42.6 mg/dL     Total Cholesterol: 205 mg/dL  -- Statin therapy for Age 20-75 with CVD risk >7.5%  Psych -- Depression screening (PHQ-9):  Depression screen [facility] 2/9 11/13/2020 11/09/2019 04/06/2019  Decreased Interest 0 0 0  Down, Depressed, Hopeless 0 0 0  PHQ - 2 Score 0 0 0      Safety -- tobacco screening: not using -- alcohol screening:  low-risk usage. -- no evidence of domestic violence or intimate partner violence.   Cancer Screening -- pap smear not collected per ASCCP guidelines -- family history of breast cancer screening: done. not at high risk. -- Mammogram -  pt will schedule -- Colon cancer (age 14+)--  up to date  Immunizations Immunization History  Administered Date(s) Administered   Influenza-Unspecified 12/08/2018, 12/15/2019   PFIZER(Purple Top)SARS-COV-2 Vaccination 03/11/2019, 04/01/2019, 02/07/2020   Tdap 06/03/2005, 11/13/2020   Zoster Recombinat (Shingrix) 11/09/2019, 01/18/2020    -- flu vaccine not up to date - will get with work -- TDAP q10 years not up to date - given today -- Shingles (age >33) up to date -- PCV-13 (age >85) - one dose followed by PPSV-23 1 year later not up to date - pt will defer 1 year -- Covid-19 Vaccine up to date   Encouraged healthy diet and exercise. Encouraged regular vision and dental care.    Lesleigh Noe, MD

## 2020-11-13 NOTE — Patient Instructions (Signed)
Preventive Care 40 Years and Older, Female Preventive care refers to lifestyle choices and visits with your health care provider that can promote health and wellness. This includes: A yearly physical exam. This is also called an annual wellness visit. Regular dental and eye exams. Immunizations. Screening for certain conditions. Healthy lifestyle choices, such as: Eating a healthy diet. Getting regular exercise. Not using drugs or products that contain nicotine and tobacco. Limiting alcohol use. What can I expect for my preventive care visit? Physical exam Your health care provider will check your: Height and weight. These may be used to calculate your BMI (body mass index). BMI is a measurement that tells if you are at a healthy weight. Heart rate and blood pressure. Body temperature. Skin for abnormal spots. Counseling Your health care provider may ask you questions about your: Past medical problems. Family's medical history. Alcohol, tobacco, and drug use. Emotional well-being. Home life and relationship well-being. Sexual activity. Diet, exercise, and sleep habits. History of falls. Memory and ability to understand (cognition). Work and work Statistician. Pregnancy and menstrual history. Access to firearms. What immunizations do I need? Vaccines are usually given at various ages, according to a schedule. Your health care provider will recommend vaccines for you based on your age, medical history, and lifestyle or other factors, such as travel or where you work. What tests do I need? Blood tests Lipid and cholesterol levels. These may be checked every 5 years, or more often depending on your overall health. Hepatitis C test. Hepatitis B test. Screening Lung cancer screening. You may have this screening every year starting at age 65 if you have a 30-pack-year history of smoking and currently smoke or have quit within the past 15 years. Colorectal cancer screening. All  adults should have this screening starting at age 65 and continuing until age 9. Your health care provider may recommend screening at age 65 if you are at increased risk. You will have tests every 1-10 years, depending on your results and the type of screening test. Diabetes screening. This is done by checking your blood sugar (glucose) after you have not eaten for a while (fasting). You may have this done every 1-3 years. Mammogram. This may be done every 1-2 years. Talk with your health care provider about how often you should have regular mammograms. Abdominal aortic aneurysm (AAA) screening. You may need this if you are a current or former smoker. BRCA-related cancer screening. This may be done if you have a family history of breast, ovarian, tubal, or peritoneal cancers. Other tests STD (sexually transmitted disease) testing, if you are at risk. Bone density scan. This is done to screen for osteoporosis. You may have this done starting at age 65. Talk with your health care provider about your test results, treatment options, and if necessary, the need for more tests. Follow these instructions at home: Eating and drinking  Eat a diet that includes fresh fruits and vegetables, whole grains, lean protein, and low-fat dairy products. Limit your intake of foods with high amounts of sugar, saturated fats, and salt. Take vitamin and mineral supplements as recommended by your health care provider. Do not drink alcohol if your health care provider tells you not to drink. If you drink alcohol: Limit how much you have to 0-1 drink a day. Be aware of how much alcohol is in your drink. In the [place] one drink equals one 12 oz bottle of beer (355 mL), one 5 oz glass of wine (148 mL), or one  1 oz glass of hard liquor (44 mL). Lifestyle Take daily care of your teeth and gums. Brush your teeth every morning and night with fluoride toothpaste. Floss one time each day. Stay active. Exercise for at least  30 minutes 5 or more days each week. Do not use any products that contain nicotine or tobacco, such as cigarettes, e-cigarettes, and chewing tobacco. If you need help quitting, ask your health care provider. Do not use drugs. If you are sexually active, practice safe sex. Use a condom or other form of protection in order to prevent STIs (sexually transmitted infections). Talk with your health care provider about taking a low-dose aspirin or statin. Find healthy ways to cope with stress, such as: Meditation, yoga, or listening to music. Journaling. Talking to a trusted person. Spending time with friends and family. Safety Always wear your seat belt while driving or riding in a vehicle. Do not drive: If you have been drinking alcohol. Do not ride with someone who has been drinking. When you are tired or distracted. While texting. Wear a helmet and other protective equipment during sports activities. If you have firearms in your house, make sure you follow all gun safety procedures. What's next? Visit your health care provider once a year for an annual wellness visit. Ask your health care provider how often you should have your eyes and teeth checked. Stay up to date on all vaccines. This information is not intended to replace advice given to you by your health care provider. Make sure you discuss any questions you have with your health care provider. Document Revised: 04/13/2020 Document Reviewed: 01/28/2018 Elsevier Patient Education  2022 [employer].

## 2020-12-03 ENCOUNTER — Other Ambulatory Visit: Payer: Self-pay | Admitting: Family Medicine

## 2020-12-03 DIAGNOSIS — Z1231 Encounter for screening mammogram for malignant neoplasm of breast: Secondary | ICD-10-CM

## 2020-12-05 ENCOUNTER — Other Ambulatory Visit: Payer: Self-pay

## 2020-12-19 ENCOUNTER — Other Ambulatory Visit: Payer: Self-pay

## 2020-12-19 ENCOUNTER — Ambulatory Visit
Admission: RE | Admit: 2020-12-19 | Discharge: 2020-12-19 | Disposition: A | Discharge status: Home / Self Care | Payer: 59 | Source: Ambulatory Visit | Attending: Family Medicine | Admitting: Family Medicine

## 2020-12-19 DIAGNOSIS — Z1231 Encounter for screening mammogram for malignant neoplasm of breast: Secondary | ICD-10-CM

## 2021-01-04 ENCOUNTER — Ambulatory Visit: Payer: 59 | Admitting: Pulmonary Disease

## 2021-01-04 ENCOUNTER — Encounter: Payer: Self-pay | Admitting: Pulmonary Disease

## 2021-01-04 ENCOUNTER — Other Ambulatory Visit: Payer: Self-pay

## 2021-01-04 VITALS — BP 126/70 | HR 84 | Temp 98.1°F | Ht 64.0 in | Wt 181.2 lb

## 2021-01-04 DIAGNOSIS — D869 Sarcoidosis, unspecified: Secondary | ICD-10-CM | POA: Diagnosis not present

## 2021-01-04 DIAGNOSIS — R252 Cramp and spasm: Secondary | ICD-10-CM

## 2021-01-04 DIAGNOSIS — J4540 Moderate persistent asthma, uncomplicated: Secondary | ICD-10-CM

## 2021-01-04 NOTE — Progress Notes (Signed)
Subjective:    Patient ID: Meghan Leon, female    DOB: 1955-06-02, 65 y.o.   MRN: [id number] Chief Complaint  Patient presents with   Follow-up    Asthma-     HPI Meghan Leon is a 65 year old lifelong never smoker with a history of moderate persistent asthma and sarcoidosis.  She follows here for the same.  This is a scheduled visit.  Last visit here was on 19 September 2020, she was doing well at that time.  Subsequently after that she had COVID-19 on 6 September.  She was treated with molnupiravir and had no sequela.  She is on Breztri due to loss of coverage for Advair HFA.  She cannot tolerate powdered medications. She has not had any fevers, chills or sweats since her COVID diagnosis.  No shortness of breath.  Office well-controlled.  No hemoptysis.  She has not needed to use albuterol lately.  She is using Breztri only 1 puff twice a day due to muscle cramps when she takes the full dose.  She notes that this actually keeps her well controlled.  Overall, she feels well and looks well.   DATA: 19 July 2019 PFTs: FEV1 1.19 L or 60% predicted, FVC 1.78 L or 70% predicted.  Postbronchodilator there is a 21% net change on FEV1 to 1.45 L or 73% predicted and equally on 19% change in FVC demonstrating decreased air trapping.  Lung volumes are mildly reduced consistent with the patient's sarcoidosis.  Diffusion capacity mildly reduced when alveolar volume is taken into account. 19 July 2019 CT scan of the chest:  shows calcified mediastinal lymphadenopathy consistent with sarcoidosis and fibrotic changes on the right upper lobe clustered perilymphatic and perifissural nodules consistent with sarcoidosis.  The findings are stable. 19 January 2020 CT scan of the chest: Changes of sarcoidosis with one area slightly enlarged however still consistent with sarcoidosis. 08/10/2020 CT chest without contrast: Persistent changes consistent with sarcoidosis.  Review of Systems A 10 point review of systems was  performed and it is as noted above otherwise negative.  Patient Active Problem List   Diagnosis Date Noted   Sarcoidosis 08/04/2019   Acute pain of left shoulder 08/04/2019   Essential hypertension 04/06/2019   Asthma 04/06/2019   Hyperlipidemia 04/06/2019   Social History   Tobacco Use   Smoking status: Never   Smokeless tobacco: Never   Tobacco comments:    never  Substance Use Topics   Alcohol use: No   No Known Allergies  Current Meds  Medication Sig   amLODipine (NORVASC) 5 MG tablet TAKE 1 TABLET BY MOUTH DAILY.   Budeson-Glycopyrrol-Formoterol (BREZTRI AEROSPHERE) 160-9-4.8 MCG/ACT AERO Inhale 160 mcg into the lungs in the morning and at bedtime.   Nebulizers (COMPRESSOR/NEBULIZER) MISC 1 Units by Does not apply route every 4 (four) hours as needed.   [DISCONTINUED] albuterol (PROVENTIL) (2.5 MG/3ML) 0.083% nebulizer solution Take 3 mLs (2.5 mg total) by nebulization every 4 (four) hours as needed for wheezing or shortness of breath.   [DISCONTINUED] albuterol (VENTOLIN HFA) 108 (90 Base) MCG/ACT inhaler Inhale 2 puffs into the lungs every 6 (six) hours as needed for wheezing or shortness of breath.   Immunization History  Administered Date(s) Administered   Influenza-Unspecified 12/08/2018, 12/15/2019   PFIZER(Purple Top)SARS-COV-2 Vaccination 03/11/2019, 04/01/2019, 02/07/2020   Tdap 06/03/2005, 11/13/2020   Zoster Recombinat (Shingrix) 11/09/2019, 01/18/2020       Objective:   Physical Exam BP 126/70 (BP Location: Left Arm, Patient Position: Sitting, Cuff Size:  Normal)   Pulse 84   Temp 98.1 F (36.7 C) (Oral)   Ht 5\' 4"  (1.626 m)   Wt 181 lb 3.2 oz (82.2 kg)   SpO2 97%   BMI 31.10 kg/m  GENERAL: Awake, alert, fully ambulatory.  No respiratory distress.  No conversational dyspnea. HEAD: Normocephalic, atraumatic.  EYES: Pupils equal, round, reactive to light.  No scleral icterus.  MOUTH: Nose/mouth/throat not examined due to masking requirements for  COVID 19. NECK: Supple. No thyromegaly. Trachea midline. No JVD.  No adenopathy. PULMONARY: Good air entry bilaterally.  Very mild end expiratory wheezes noted. CARDIOVASCULAR: S1 and S2. Regular rate and rhythm.  No rubs murmurs or gallops heard. GASTROINTESTINAL: No abdominal distention noted. MUSCULOSKELETAL: No joint deformity, no clubbing, no edema.  NEUROLOGIC: No focal deficit, no gait disturbance, speech is fluent. SKIN: Intact,warm,dry.  Limited exam shows no rashes. PSYCH: Mood and behavior normal      Assessment & Plan:     ICD-10-CM   1. Moderate persistent asthma in adult without complication  J45.40    Continue Breztri at present dose of 1 puff BID For exacerbation increase Breztri to 2 puffs twice a day Call for exacerbation    2. Sarcoidosis  D86.9    Asymptomatic No need for steroids at present    3. Muscle cramps  R25.2    Related to LABA in [name]/calcium supplement PRN for muscle cramps       Patient appears to be well compensated.  She is up-to-date on flu vaccine.  We will see her in follow-up in 6 months time she is to contact [place] prior to that time should any new difficulties arise.  She will continue Breztri 1 puff twice a day since it appears to be controlling her.  None of the other medications are covered by her insurance company.    [place], MD Advanced Bronchoscopy P[facility]    *This note was dictated using voice recognition software/Dragon.  Despite best efforts to proofread, errors can occur which can change the meaning.  Any change was purely unintentional.

## 2021-01-04 NOTE — Patient Instructions (Signed)
You were noted to be doing well today  We will see her in follow-up in 6 months time call sooner should any new problems arise.

## 2021-01-08 ENCOUNTER — Other Ambulatory Visit: Payer: Self-pay

## 2021-02-15 ENCOUNTER — Other Ambulatory Visit: Payer: Self-pay

## 2021-03-22 ENCOUNTER — Other Ambulatory Visit: Payer: Self-pay

## 2021-03-22 ENCOUNTER — Ambulatory Visit: Payer: 59 | Attending: Internal Medicine

## 2021-03-22 DIAGNOSIS — Z23 Encounter for immunization: Secondary | ICD-10-CM

## 2021-03-22 MED ORDER — PFIZER COVID-19 VAC BIVALENT 30 MCG/0.3ML IM SUSP
INTRAMUSCULAR | 0 refills | Status: DC
  Filled 2021-03-22: qty 0.3, 1d supply, fill #0

## 2021-03-22 NOTE — Progress Notes (Signed)
° °  Covid-19 Vaccination Clinic  Name:  Meghan Leon    MRN: [id number] DOB: 07-Jun-1955  03/22/2021  [name] was observed post Covid-19 immunization for 15 minutes without incident. She was provided with Vaccine Information Sheet and instruction to access the V-Safe system.   [name] was instructed to call 911 with any severe reactions post vaccine: Difficulty breathing  Swelling of face and throat  A fast heartbeat  A bad rash all over body  Dizziness and weakness   Immunizations Administered     Name Date Dose VIS Date Route   Pfizer Covid-19 Vaccine Bivalent Booster 03/22/2021  9:02 AM 0.3 mL 10/17/2020 Intramuscular   Manufacturer: [employer], [employer]   Lot: [id number]   NDC: [address]-4      Drusilla Kanner, PharmD, MBA Clinical Acute Care Pharmacist

## 2021-04-08 ENCOUNTER — Other Ambulatory Visit: Payer: Self-pay

## 2021-05-14 ENCOUNTER — Other Ambulatory Visit: Payer: Self-pay

## 2021-05-26 IMAGING — CR DG CHEST 2V
1 series · 2 of 2 positions shown · non-contrast
Comparison: Single-view of the chest 06/13/2009.

CLINICAL DATA: Shortness of breath for 1-2 months.

EXAM:
CHEST - 2 VIEW

[Series 1: dg chest 2 view · 0.14mm/px · 2 of 2 slices shown]
[im 1/2]
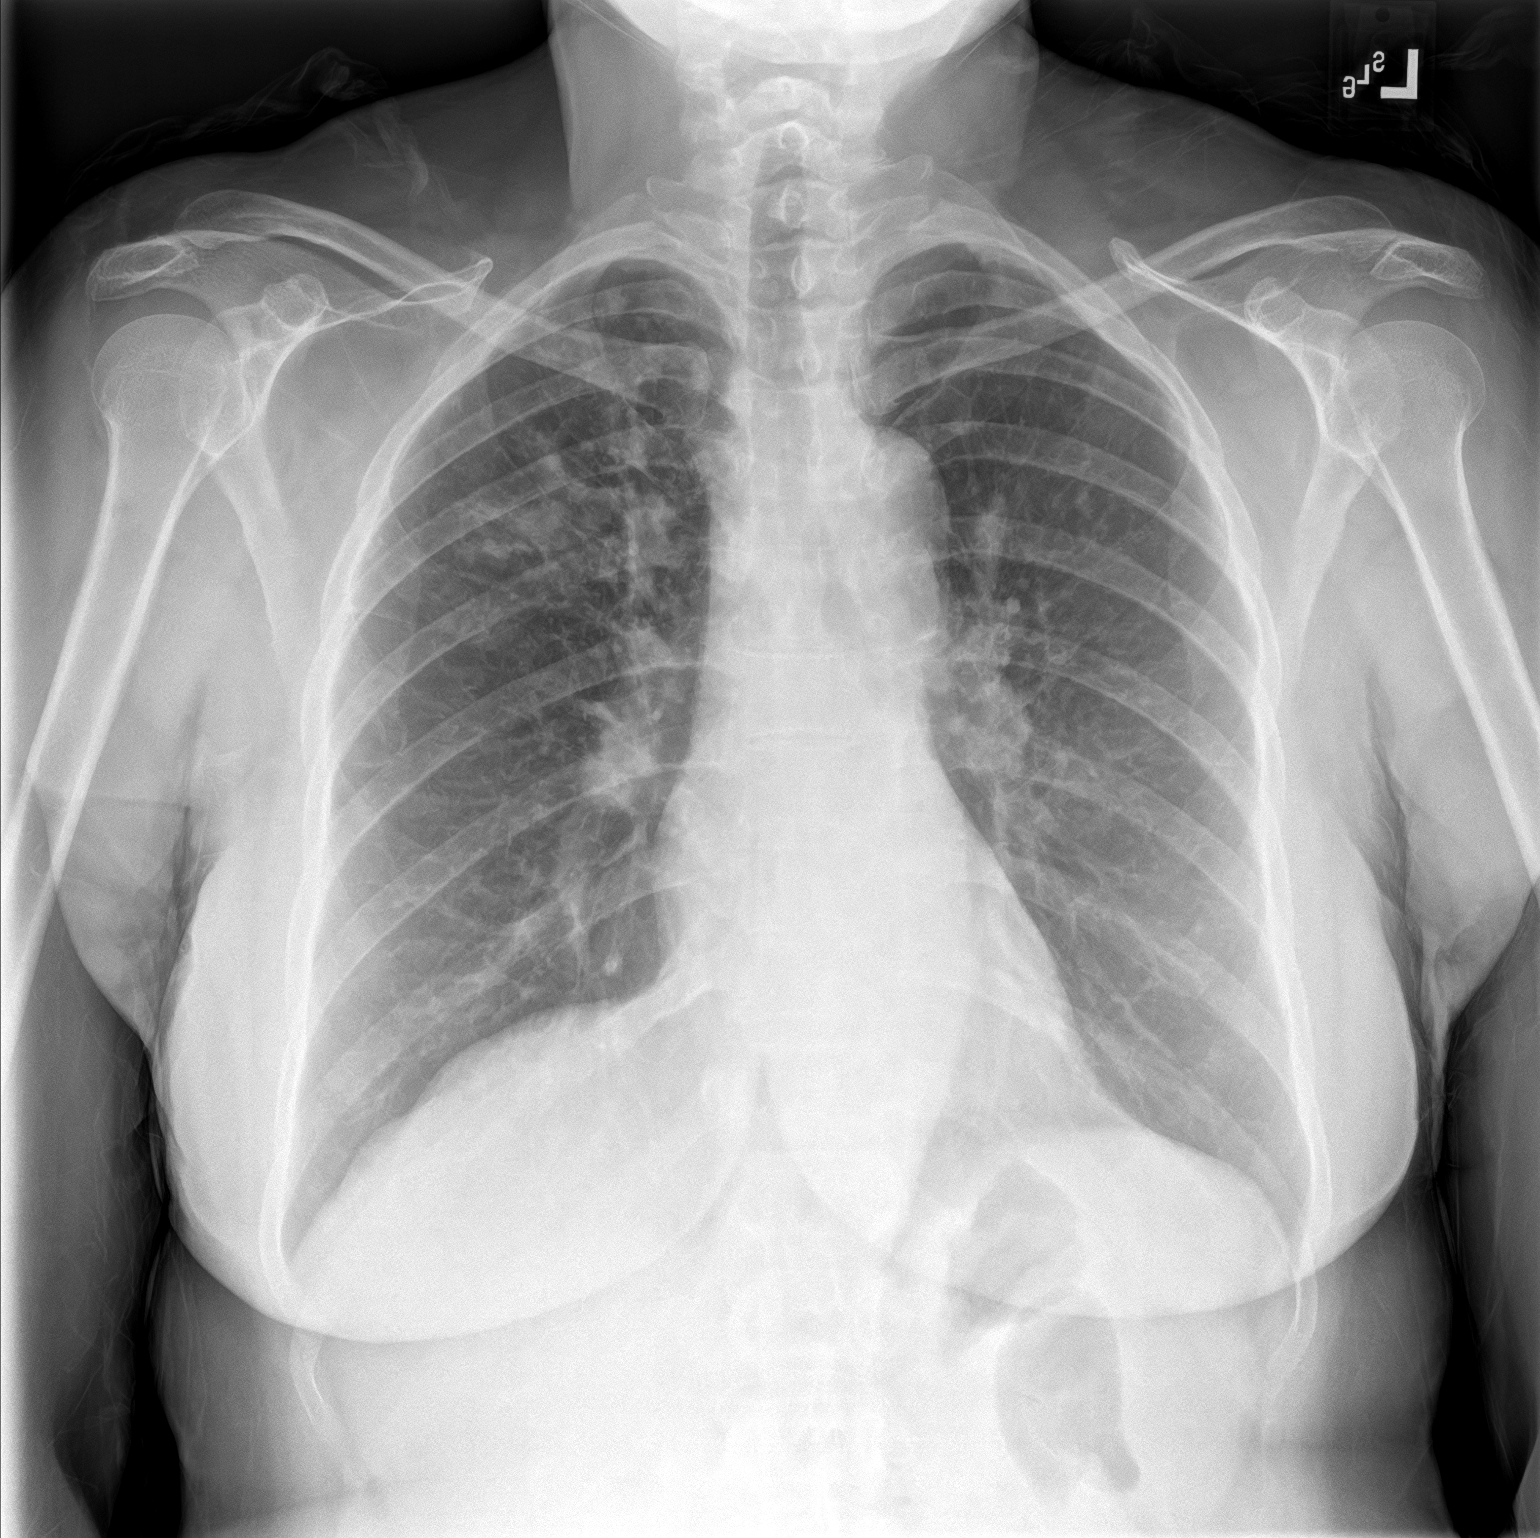
[im 2/2]
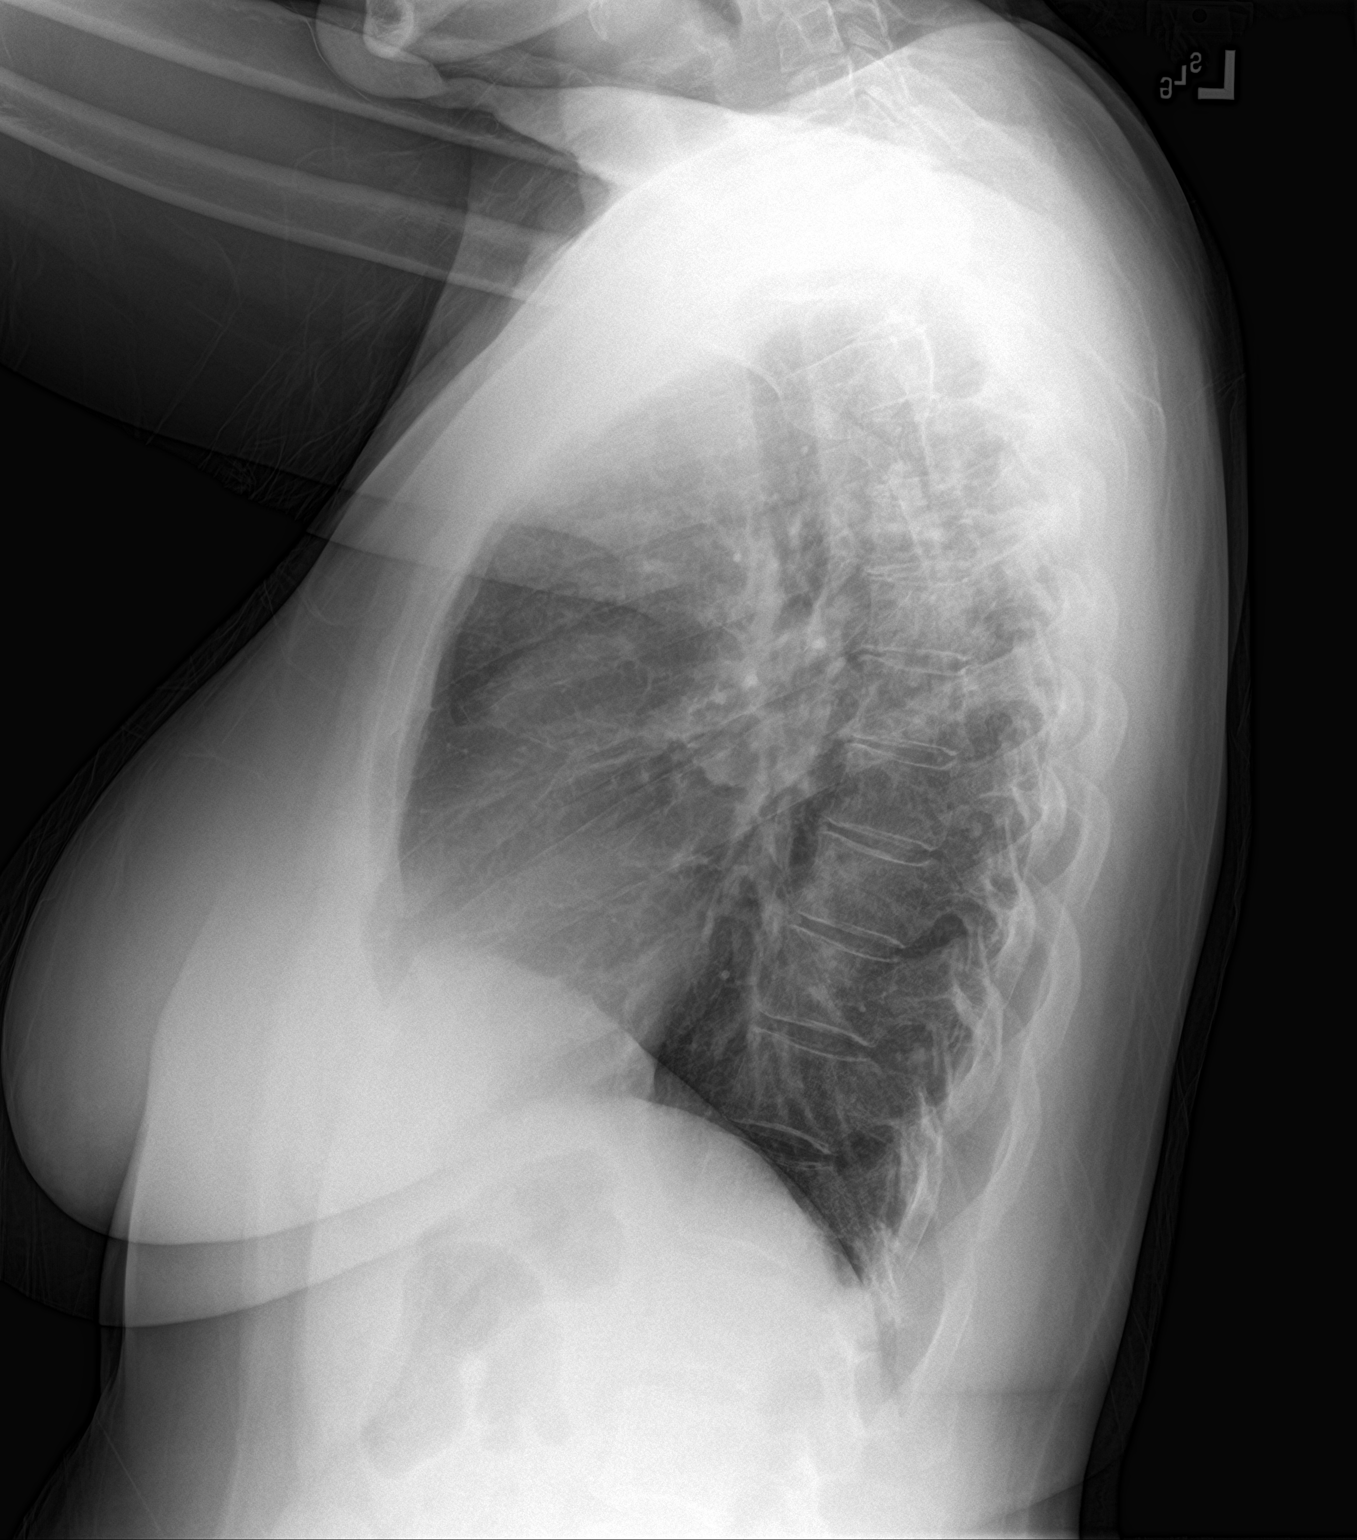

[2 of 2 positions shown; findings below may reference images not displayed]

FINDINGS: Nodular airspace disease is present in the right upper lobe. The
left lung is clear. Heart size is normal. No pneumothorax or pleural
fluid. No acute or focal bony abnormality.
IMPRESSION: Nodular airspace opacities in the right upper lobe worrisome for
infection, atypical infection is possible. Recommend repeat PA and
lateral films in 6-8 weeks after appropriate therapy.

## 2021-06-03 ENCOUNTER — Telehealth: Payer: 59 | Admitting: Family Medicine

## 2021-06-03 VITALS — BP 118/82 | HR 81 | Temp 98.0°F | Ht 63.25 in | Wt 183.6 lb

## 2021-06-03 DIAGNOSIS — M25561 Pain in right knee: Secondary | ICD-10-CM | POA: Diagnosis not present

## 2021-06-03 NOTE — Assessment & Plan Note (Signed)
Advised continuing anti-inflammatories and brace as needed.  If not resolving to normal walking baseline in 2 weeks update and we will plan for physical therapy referral.  Etiology unclear at this time without any pain elicited on exam but normal ligaments ?

## 2021-06-03 NOTE — Progress Notes (Signed)
? ?  Subjective:  ? ?  ?Meghan Leon is a 66 y.o. female presenting for Knee Pain (R x 8 days. Walking increases pain starting from thigh into knee) ?  ? ? ?Knee Pain  ?The incident occurred more than 1 week ago. Incident location: no injur. There was no injury mechanism (was doing some lifting of rocks). The pain is present in the right knee and right thigh. Quality: when walking only - shooting. The pain is mild. The pain has been Improving since onset. Associated symptoms include an inability to bear weight. Pertinent negatives include no loss of motion, loss of sensation, muscle weakness, numbness or tingling. The symptoms are aggravated by weight bearing and movement. She has tried NSAIDs and heat (brace) for the symptoms.  ? ?No buckling ?Has felt a pop when it initially occurred ?Ibuprofen 200 mg bid x 4 days ? ?Review of Systems  ?Neurological:  Negative for tingling and numbness.  ? ? ?Social History  ? ?Tobacco Use  ?Smoking Status Never  ?Smokeless Tobacco Never  ?Tobacco Comments  ? never  ? ? ? ?   ?Objective:  ?  ?BP Readings from Last 3 Encounters:  ?06/03/21 118/82  ?01/04/21 126/70  ?11/13/20 128/78  ? ?Wt Readings from Last 3 Encounters:  ?06/03/21 183 lb 9 oz (83.3 kg)  ?01/04/21 181 lb 3.2 oz (82.2 kg)  ?11/13/20 182 lb 6 oz (82.7 kg)  ? ? ?BP 118/82   Pulse 81   Temp 98 ?F (36.7 ?C) (Oral)   Ht 5' 3.25" (1.607 m)   Wt 183 lb 9 oz (83.3 kg)   SpO2 97%   BMI 32.26 kg/m?  ? ? ?Physical Exam ?Constitutional:   ?   General: She is not in acute distress. ?   Appearance: She is well-developed. She is not diaphoretic.  ?HENT:  ?   Right Ear: External ear normal.  ?   Left Ear: External ear normal.  ?   Nose: Nose normal.  ?Eyes:  ?   Conjunctiva/sclera: Conjunctivae normal.  ?Cardiovascular:  ?   Rate and Rhythm: Normal rate.  ?Pulmonary:  ?   Effort: Pulmonary effort is normal.  ?Musculoskeletal:  ?   Cervical back: Neck supple.  ?   Comments: Knee (Right) ?Inspection: no erythema,  swelling ?Palpation: no ttp along the joint line, posterior, patella ?ROM: normal w/o pain ?Strength: Normal ?Ligaments: no laxity in testing  ?Skin: ?   General: Skin is warm and dry.  ?   Capillary Refill: Capillary refill takes less than 2 seconds.  ?Neurological:  ?   Mental Status: She is alert. Mental status is at baseline.  ?Psychiatric:     ?   Mood and Affect: Mood normal.     ?   Behavior: Behavior normal.  ? ? ? ? ? ?   ?Assessment & Plan:  ? ?Problem List Items Addressed This Visit   ? ?  ? Other  ? Acute pain of right knee - Primary  ?  Advised continuing anti-inflammatories and brace as needed.  If not resolving to normal walking baseline in 2 weeks update and we will plan for physical therapy referral.  Etiology unclear at this time without any pain elicited on exam but normal ligaments ? ?  ?  ? ? ? ?Return if symptoms worsen or fail to improve. ? ?Lesleigh Noe, MD ? ? ? ?

## 2021-06-03 NOTE — Patient Instructions (Signed)
Pain ?- try voltaren gel ?- or ok for as needed ibuprofen  ? ?-if not resolved in 2 weeks - should plan for physical therapy (call for referral) ? ?- if worsening - will plan for physical therapy sooner  ?

## 2021-07-03 ENCOUNTER — Ambulatory Visit: Payer: 59 | Admitting: Pulmonary Disease

## 2021-07-03 ENCOUNTER — Encounter: Payer: Self-pay | Admitting: Pulmonary Disease

## 2021-07-03 ENCOUNTER — Other Ambulatory Visit: Payer: Self-pay

## 2021-07-03 VITALS — BP 118/72 | HR 75 | Temp 97.6°F | Ht 63.25 in | Wt 185.4 lb

## 2021-07-03 DIAGNOSIS — R918 Other nonspecific abnormal finding of lung field: Secondary | ICD-10-CM

## 2021-07-03 DIAGNOSIS — J4540 Moderate persistent asthma, uncomplicated: Secondary | ICD-10-CM

## 2021-07-03 DIAGNOSIS — D869 Sarcoidosis, unspecified: Secondary | ICD-10-CM

## 2021-07-03 MED ORDER — ALBUTEROL SULFATE HFA 108 (90 BASE) MCG/ACT IN AERS
2.0000 | INHALATION_SPRAY | Freq: Four times a day (QID) | RESPIRATORY_TRACT | 2 refills | Status: DC | PRN
  Filled 2021-07-03: qty 8.5, 25d supply, fill #0

## 2021-07-03 NOTE — Patient Instructions (Signed)
We will get another scan of the chest in June.  If this is stable no further CT scans are needed. ? ?We will continue Breztri 2 puffs twice a day.  A prescription for albuterol was sent to your pharmacy this is a rescue inhaler to have just in case. ? ?See you in follow-up in 6 months time call sooner should any new problems arise ?

## 2021-07-03 NOTE — Progress Notes (Signed)
Subjective:    Patient ID: Meghan Leon, female    DOB: Oct 22, 1955, 66 y.o.   MRN: [id number] Patient Care Team: Lynnda Child, MD as PCP - General [facility])  Chief Complaint  Patient presents with   Follow-up    No current sx.     HPI Meghan Leon is a 66 year old lifelong never smoker with a history of moderate persistent asthma and sarcoidosis.  She follows here for the same.  This is a scheduled visit.  Last visit here was on 04 January 2021, she was doing well at that time. She is on Breztri due to loss of coverage for Advair HFA.  She cannot tolerate powdered medications.  Insurance will not cover Symbicort.  She has not had any fevers, chills or sweats.  No shortness of breath.  Shortness of breath well-controlled.  No hemoptysis.  She has not needed to use albuterol lately.  She is compliant with Breztri twice a day now able to take the full dose.  She notes that this actually keeps her well controlled.  She will be due for CT scan of the chest in June to monitor her sarcoidosis.  Needs albuterol refill   Overall, she feels well and looks well.  DATA: 19 July 2019 PFTs: FEV1 1.19 L or 60% predicted, FVC 1.78 L or 70% predicted.  Postbronchodilator there is a 21% net change on FEV1 to 1.45 L or 73% predicted and equally on 19% change in FVC demonstrating decreased air trapping.  Lung volumes are mildly reduced consistent with the patient's sarcoidosis.  Diffusion capacity mildly reduced when alveolar volume is taken into account. 19 July 2019 CT scan of the chest:  shows calcified mediastinal lymphadenopathy consistent with sarcoidosis and fibrotic changes on the right upper lobe clustered perilymphatic and perifissural nodules consistent with sarcoidosis.  The findings are stable. 19 January 2020 CT scan of the chest: Changes of sarcoidosis with one area slightly enlarged however still consistent with sarcoidosis. 08/10/2020 CT chest without contrast: Persistent changes  consistent with sarcoidosis.    Review of Systems A 10 point review of systems was performed and it is as noted above otherwise negative.  Patient Active Problem List   Diagnosis Date Noted   Acute pain of right knee 06/03/2021   Sarcoidosis 08/04/2019   Acute pain of left shoulder 08/04/2019   Essential hypertension 04/06/2019   Asthma 04/06/2019   Hyperlipidemia 04/06/2019   Social History   Tobacco Use   Smoking status: Never   Smokeless tobacco: Never   Tobacco comments:    never  Substance Use Topics   Alcohol use: No   No Known Allergies  Current Meds  Medication Sig   albuterol (VENTOLIN HFA) 108 (90 Base) MCG/ACT inhaler Inhale 2 puffs into the lungs every 6 (six) hours as needed for wheezing or shortness of breath.   amLODipine (NORVASC) 5 MG tablet TAKE 1 TABLET BY MOUTH DAILY.   Budeson-Glycopyrrol-Formoterol (BREZTRI AEROSPHERE) 160-9-4.8 MCG/ACT AERO Inhale 160 mcg into the lungs in the morning and at bedtime.   Immunization History  Administered Date(s) Administered   Influenza-Unspecified 12/08/2018, 12/15/2019   PFIZER(Purple Top)SARS-COV-2 Vaccination 03/11/2019, 04/01/2019, 02/07/2020   Pfizer Covid-19 Vaccine Bivalent Booster 11yrs & up 03/22/2021   Tdap 06/03/2005, 11/13/2020   Zoster Recombinat (Shingrix) 11/09/2019, 01/18/2020       Objective:   Physical Exam BP 118/72 (BP Location: Left Arm, Cuff Size: Normal)   Pulse 75   Temp 97.6 F (36.4 C) (Temporal)  Ht 5' 3.25" (1.607 m)   Wt 185 lb 6.4 oz (84.1 kg)   SpO2 96%   BMI 32.58 kg/m   GENERAL: Awake, alert, fully ambulatory.  No respiratory distress.  No conversational dyspnea. HEAD: Normocephalic, atraumatic.  EYES: Pupils equal, round, reactive to light.  No scleral icterus.  MOUTH: Poor dentition, fillings, oral mucosa moist.  No thrush.Marland Kitchen NECK: Supple. No thyromegaly. Trachea midline. No JVD.  No adenopathy. PULMONARY: Good air entry bilaterally.  No adventitious  sounds. CARDIOVASCULAR: S1 and S2. Regular rate and rhythm.  No rubs murmurs or gallops heard. GASTROINTESTINAL: No abdominal distention noted. MUSCULOSKELETAL: No joint deformity, no clubbing, no edema.  NEUROLOGIC: No focal deficit, no gait disturbance, speech is fluent. SKIN: Intact,warm,dry.  Limited exam shows no rashes. PSYCH: Mood and behavior normal    Assessment & Plan:     ICD-10-CM   1. Moderate persistent asthma in adult without complication  J45.40    Continue Breztri 2 puffs twice a day Continue as needed albuterol    2. Sarcoidosis  D86.9 CT CHEST WO CONTRAST   Appears quiescent clinically    3. Abnormal findings on diagnostic imaging of lung  R91.8 CT CHEST WO CONTRAST   Due for repeat CT chest June 2024 Findings related to sarcoidosis     Meds ordered this encounter  Medications   albuterol (VENTOLIN HFA) 108 (90 Base) MCG/ACT inhaler    Sig: Inhale 2 puffs into the lungs every 6 (six) hours as needed for wheezing or shortness of breath.    Dispense:  8 g    Refill:  2   Patient appears well compensated.  Continue current regimen.  Follow-up in 6 months time she is to contact us prior to that time should any new difficulties arise.  We will notify her if any further workup is necessary after her upcoming CT chest in June.   Gailen Shelter, MD Advanced Bronchoscopy PCCM [facility]-    *This note was dictated using voice recognition software/Dragon.  Despite best efforts to proofread, errors can occur which can change the meaning. Any transcriptional errors that result from this process are unintentional and may not be fully corrected at the time of dictation.

## 2021-07-22 ENCOUNTER — Other Ambulatory Visit: Payer: Self-pay | Admitting: Family Medicine

## 2021-07-22 ENCOUNTER — Other Ambulatory Visit: Payer: Self-pay

## 2021-07-23 ENCOUNTER — Other Ambulatory Visit: Payer: Self-pay

## 2021-07-23 MED ORDER — AMLODIPINE BESYLATE 5 MG PO TABS
5.0000 mg | ORAL_TABLET | Freq: Every day | ORAL | 1 refills | Status: DC
  Filled 2021-07-23: qty 90, 90d supply, fill #0
  Filled 2021-10-18: qty 90, 90d supply, fill #1

## 2021-08-07 ENCOUNTER — Other Ambulatory Visit: Payer: Self-pay

## 2021-08-07 MED ORDER — BREZTRI AEROSPHERE 160-9-4.8 MCG/ACT IN AERO
INHALATION_SPRAY | RESPIRATORY_TRACT | 7 refills | Status: DC
  Filled 2021-08-07: qty 10.7, 30d supply, fill #0
  Filled 2021-09-02: qty 10.7, 30d supply, fill #1
  Filled 2021-09-30: qty 10.7, 30d supply, fill #2

## 2021-08-12 ENCOUNTER — Ambulatory Visit
Admission: RE | Admit: 2021-08-12 | Discharge: 2021-08-12 | Disposition: A | Discharge status: Home / Self Care | Payer: 59 | Source: Ambulatory Visit | Attending: Pulmonary Disease | Admitting: Pulmonary Disease

## 2021-08-12 DIAGNOSIS — D869 Sarcoidosis, unspecified: Secondary | ICD-10-CM | POA: Diagnosis not present

## 2021-08-12 DIAGNOSIS — R911 Solitary pulmonary nodule: Secondary | ICD-10-CM | POA: Diagnosis not present

## 2021-08-12 DIAGNOSIS — R918 Other nonspecific abnormal finding of lung field: Secondary | ICD-10-CM | POA: Diagnosis not present

## 2021-08-13 ENCOUNTER — Telehealth: Payer: Self-pay | Admitting: Pulmonary Disease

## 2021-08-13 DIAGNOSIS — R0602 Shortness of breath: Secondary | ICD-10-CM

## 2021-09-02 ENCOUNTER — Other Ambulatory Visit: Payer: Self-pay

## 2021-09-11 ENCOUNTER — Ambulatory Visit (INDEPENDENT_AMBULATORY_CARE_PROVIDER_SITE_OTHER): Payer: 59

## 2021-09-11 DIAGNOSIS — R0602 Shortness of breath: Secondary | ICD-10-CM | POA: Diagnosis not present

## 2021-09-12 LAB — ECHOCARDIOGRAM COMPLETE
AR max vel: 2.53 cm2
AV Area VTI: 2.37 cm2
AV Area mean vel: 2.42 cm2
AV Mean grad: 2 mmHg
AV Peak grad: 4.5 mmHg
Ao pk vel: 1.06 m/s
Area-P 1/2: 2.77 cm2
Calc EF: 51.4 %
S' Lateral: 2.7 cm
Single Plane A2C EF: 50.8 %
Single Plane A4C EF: 52.3 %

## 2021-09-30 ENCOUNTER — Other Ambulatory Visit: Payer: Self-pay

## 2021-10-18 ENCOUNTER — Other Ambulatory Visit: Payer: Self-pay

## 2021-11-01 DIAGNOSIS — H5213 Myopia, bilateral: Secondary | ICD-10-CM | POA: Diagnosis not present

## 2021-11-01 DIAGNOSIS — H524 Presbyopia: Secondary | ICD-10-CM | POA: Diagnosis not present

## 2021-11-01 DIAGNOSIS — H52223 Regular astigmatism, bilateral: Secondary | ICD-10-CM | POA: Diagnosis not present

## 2021-11-06 ENCOUNTER — Other Ambulatory Visit: Payer: Self-pay | Admitting: Family Medicine

## 2021-11-06 DIAGNOSIS — Z1231 Encounter for screening mammogram for malignant neoplasm of breast: Secondary | ICD-10-CM

## 2021-11-18 ENCOUNTER — Encounter: Payer: 59 | Admitting: Family Medicine

## 2021-11-29 ENCOUNTER — Other Ambulatory Visit: Payer: Self-pay | Admitting: Pulmonary Disease

## 2021-11-29 ENCOUNTER — Other Ambulatory Visit: Payer: Self-pay

## 2021-11-29 MED FILL — Budesonide-Glycopyrrolate-Formoterol Aers 160-9-4.8 MCG/ACT: RESPIRATORY_TRACT | 30 days supply | Qty: 10.7 | Fill #0 | Status: AC

## 2021-12-24 ENCOUNTER — Other Ambulatory Visit: Payer: Self-pay

## 2021-12-24 MED FILL — Budesonide-Glycopyrrolate-Formoterol Aers 160-9-4.8 MCG/ACT: RESPIRATORY_TRACT | 30 days supply | Qty: 10.7 | Fill #1 | Status: AC

## 2021-12-27 ENCOUNTER — Ambulatory Visit
Admission: RE | Admit: 2021-12-27 | Discharge: 2021-12-27 | Disposition: A | Discharge status: Home / Self Care | Payer: 59 | Source: Ambulatory Visit | Attending: Family Medicine | Admitting: Family Medicine

## 2021-12-27 DIAGNOSIS — Z1231 Encounter for screening mammogram for malignant neoplasm of breast: Secondary | ICD-10-CM

## 2021-12-30 ENCOUNTER — Encounter: Payer: 59 | Admitting: Family

## 2022-01-13 ENCOUNTER — Other Ambulatory Visit: Payer: Self-pay

## 2022-01-15 ENCOUNTER — Other Ambulatory Visit: Payer: Self-pay

## 2022-01-15 MED ORDER — AMLODIPINE BESYLATE 5 MG PO TABS
5.0000 mg | ORAL_TABLET | Freq: Every day | ORAL | 1 refills | Status: DC
  Filled 2022-01-15: qty 90, 90d supply, fill #0
  Filled 2022-05-03: qty 90, 90d supply, fill #1

## 2022-01-23 MED FILL — Budesonide-Glycopyrrolate-Formoterol Aers 160-9-4.8 MCG/ACT: RESPIRATORY_TRACT | 30 days supply | Qty: 10.7 | Fill #2 | Status: AC

## 2022-01-24 ENCOUNTER — Other Ambulatory Visit: Payer: Self-pay

## 2022-02-11 ENCOUNTER — Encounter: Payer: Self-pay | Admitting: Nurse Practitioner

## 2022-02-11 ENCOUNTER — Ambulatory Visit: Payer: 59 | Admitting: Nurse Practitioner

## 2022-02-11 VITALS — BP 132/78 | HR 78 | Temp 98.2°F | Resp 16 | Ht 63.25 in | Wt 182.1 lb

## 2022-02-11 DIAGNOSIS — Z0000 Encounter for general adult medical examination without abnormal findings: Secondary | ICD-10-CM | POA: Diagnosis not present

## 2022-02-11 DIAGNOSIS — D869 Sarcoidosis, unspecified: Secondary | ICD-10-CM

## 2022-02-11 DIAGNOSIS — I10 Essential (primary) hypertension: Secondary | ICD-10-CM

## 2022-02-11 DIAGNOSIS — E785 Hyperlipidemia, unspecified: Secondary | ICD-10-CM | POA: Diagnosis not present

## 2022-02-11 DIAGNOSIS — E669 Obesity, unspecified: Secondary | ICD-10-CM | POA: Diagnosis not present

## 2022-02-11 DIAGNOSIS — Z23 Encounter for immunization: Secondary | ICD-10-CM

## 2022-02-11 DIAGNOSIS — J4540 Moderate persistent asthma, uncomplicated: Secondary | ICD-10-CM

## 2022-02-11 LAB — HEMOGLOBIN A1C: Hgb A1c MFr Bld: 6.4 % (ref 4.6–6.5)

## 2022-02-11 LAB — LIPID PANEL
Cholesterol: 235 mg/dL — ABNORMAL HIGH (ref 0–200)
HDL: 43.9 mg/dL (ref >39.00)
LDL Cholesterol: 167 mg/dL — ABNORMAL HIGH (ref 0–99)
NonHDL: 191.18
Total CHOL/HDL Ratio: 5
Triglycerides: 122 mg/dL (ref 0.0–149.0)
VLDL: 24.4 mg/dL (ref 0.0–40.0)

## 2022-02-11 LAB — COMPREHENSIVE METABOLIC PANEL
ALT: 18 U/L (ref 0–35)
AST: 18 U/L (ref 0–37)
Albumin: 4 g/dL (ref 3.5–5.2)
Alkaline Phosphatase: 125 U/L — ABNORMAL HIGH (ref 39–117)
BUN: 12 mg/dL (ref 6–23)
CO2: 27 mEq/L (ref 19–32)
Calcium: 9.4 mg/dL (ref 8.4–10.5)
Chloride: 105 mEq/L (ref 96–112)
Creatinine, Ser: 0.98 mg/dL (ref 0.40–1.20)
GFR: 60.13 mL/min (ref >60.00)
Glucose, Bld: 92 mg/dL (ref 70–99)
Potassium: 4.1 mEq/L (ref 3.5–5.1)
Sodium: 139 mEq/L (ref 135–145)
Total Bilirubin: 0.4 mg/dL (ref 0.2–1.2)
Total Protein: 7.9 g/dL (ref 6.0–8.3)

## 2022-02-11 LAB — CBC
HCT: 42.5 % (ref 36.0–46.0)
Hemoglobin: 13.8 g/dL (ref 12.0–15.0)
MCHC: 32.6 g/dL (ref 30.0–36.0)
MCV: 77.7 fl — ABNORMAL LOW (ref 78.0–100.0)
Platelets: 305 10*3/uL (ref 150.0–400.0)
RBC: 5.47 Mil/uL — ABNORMAL HIGH (ref 3.87–5.11)
RDW: 15.1 % (ref 11.5–15.5)
WBC: 10 10*3/uL (ref 4.0–10.5)

## 2022-02-11 LAB — TSH: TSH: 1.21 u[IU]/mL (ref 0.35–5.50)

## 2022-02-11 NOTE — Progress Notes (Signed)
Established Patient Office Visit  Subjective   Patient ID: Meghan Leon, female    DOB: [birth year]  Age: 66 y.o. MRN: [id number]  Chief Complaint  Patient presents with   Establish Care    Transfer from Dr. Selena Batten    HPI  HTN: states that she will check her blood pressure once a week.  Asthma: Dr Sarina Ser she is currently on albuterol and Brextri  Sarcoidosis: Patient is currently followed by Dr. Sarina Ser last CT scan in June 2023.   HLD: Patient currently working lifestyle modifications.  She is doing yoga several times a week and has been working on her diet.  She has lost weight since last office visit.  for complete physical and follow up of chronic conditions.  Immunizations: -Tetanus:2022 -Influenza: through [employer] her employer -Shingles: UTD -Pneumonia:  up date today  -MPN:TIRW out  Diet: Fair diet. States that she will eat approx will have oatmeal in the morning prior to work. States lunch will be the bigger meal (meat and veggie), Small snack for dinner. Does not drink sweet tea or sodas. Drinks water no coffee.  Exercise: No regular exercise. Yoga several times a week   Eye exam: Completes annually. Has glasses for distance. [facility] 11/01/2021 Dental exam: Completes semi-annually.   Pap Smear: Does not qualify patient had a partial hysterectomy. Mammogram: Completed in 12/27/2021, normal repeat in 1 year  Colonoscopy: Completed in 2017, Dr Marva Panda, repeat 10 years Lung Cancer Screening: Completed in  Dexa: Completed in  Sleep: states that she goes to bed around 10-130. States she will get up around 7am. Feels rested rested. Does snore       Review of Systems  Constitutional:  Negative for chills and fever.  Respiratory:  Negative for shortness of breath.   Cardiovascular:  Negative for chest pain and leg swelling.  Gastrointestinal:  Negative for abdominal pain, constipation, diarrhea, melena, nausea and vomiting.        Bm every other day   Genitourinary:  Negative for dysuria and hematuria.  Neurological:  Negative for headaches.  Psychiatric/Behavioral:  Negative for hallucinations and suicidal ideas.       Objective:     BP 132/78   Pulse 78   Temp 98.2 F (36.8 C)   Resp 16   Ht 5' 3.25" (1.607 m)   Wt 182 lb 2 oz (82.6 kg)   SpO2 97%   BMI 32.01 kg/m  BP Readings from Last 3 Encounters:  02/11/22 132/78  07/03/21 118/72  06/03/21 118/82   Wt Readings from Last 3 Encounters:  02/11/22 182 lb 2 oz (82.6 kg)  07/03/21 185 lb 6.4 oz (84.1 kg)  06/03/21 183 lb 9 oz (83.3 kg)      Physical Exam Vitals and nursing note reviewed.  Constitutional:      Appearance: Normal appearance.  HENT:     Right Ear: Tympanic membrane, ear canal and external ear normal.     Left Ear: Tympanic membrane, ear canal and external ear normal.     Mouth/Throat:     Mouth: Mucous membranes are moist.     Pharynx: Oropharynx is clear.  Eyes:     Extraocular Movements: Extraocular movements intact.     Pupils: Pupils are equal, round, and reactive to light.  Cardiovascular:     Rate and Rhythm: Normal rate and regular rhythm.     Pulses: Normal pulses.     Heart sounds: Normal heart sounds.  Pulmonary:  Effort: Pulmonary effort is normal.     Breath sounds: Normal breath sounds.  Abdominal:     General: Bowel sounds are normal. There is no distension.     Palpations: There is no mass.     Tenderness: There is no abdominal tenderness.     Hernia: No hernia is present.  Musculoskeletal:     Right lower leg: No edema.     Left lower leg: No edema.  Lymphadenopathy:     Cervical: No cervical adenopathy.  Skin:    General: Skin is warm.  Neurological:     General: No focal deficit present.     Mental Status: She is alert.     Deep Tendon Reflexes:     Reflex Scores:      Bicep reflexes are 2+ on the right side and 2+ on the left side.      Patellar reflexes are 2+ on the right side and 2+ on  the left side.    Comments: Bilateral upper and lower extremity strength 5/5  Psychiatric:        Mood and Affect: Mood normal.        Behavior: Behavior normal.        Thought Content: Thought content normal.        Judgment: Judgment normal.      No results found for any visits on 02/11/22.    The 10-year ASCVD risk score (Arnett DK, et al., 2019) is: 12%    Assessment & Plan:   Problem List Items Addressed This Visit       Cardiovascular and Mediastinum   Essential hypertension    Patient currently maintained on amlodipine 5 mg.  Blood pressure within normal limits.  Continue take amlodipine 5 mg as prescribed      Relevant Orders   CBC   Comprehensive metabolic panel   Hemoglobin A1c   TSH   Lipid panel     Respiratory   Asthma    Patient currently followed by Dr. Vernard Gambles pulmonologist.  Maintained on [place] and albuterol as needed.  Continue take medication as prescribed follow-up with pulmonology as recommended.        Other   Hyperlipidemia    Continue healthy lifestyle modifications.  She has lost weight since last office visit.  Pending lipids today.  Patient was fasting      Relevant Orders   Hemoglobin A1c   Lipid panel   Sarcoidosis    Currently followed by Dr. Vernard Gambles.  Last CT scan of chest was 07/2021.  Follow-up with pulmonology as recommended      Preventative health care - Primary    Discussed age-appropriate immunizations and screening exams.  Patient is up-to-date on colonoscopy and mammogram.  Patient does not get cervical screening any longer as she has had a hysterectomy.  Updated pneumonia vaccine today other vaccines up-to-date for her age range.  Patient was given information at discharge of clinic about preventative healthcare maintenance with anticipatory guidance in her age range.      Relevant Orders   CBC   Comprehensive metabolic panel   Hemoglobin A1c   TSH   Lipid panel   Obesity (BMI 30-39.9)     Continue working on healthy lifestyle modifications.  She has lost some weight since last office visit      Relevant Orders   Hemoglobin A1c   Lipid panel   Other Visit Diagnoses     Need for pneumococcal 20-valent conjugate vaccination  Relevant Orders   Pneumococcal conjugate vaccine 20-valent (Prevnar 20) (Completed)       Return in about 1 year (around 02/12/2023) for CPE and labs.    Romilda Garret, NP

## 2022-02-11 NOTE — Assessment & Plan Note (Signed)
Discussed age-appropriate immunizations and screening exams.  Patient is up-to-date on colonoscopy and mammogram.  Patient does not get cervical screening any longer as she has had a hysterectomy.  Updated pneumonia vaccine today other vaccines up-to-date for her age range.  Patient was given information at discharge of clinic about preventative healthcare maintenance with anticipatory guidance in her age range.

## 2022-02-11 NOTE — Patient Instructions (Signed)
Nice to see you today I will be in touch with the labs once I have them Follow up with me in 1 year for your next physical, sooner if you need me 

## 2022-02-11 NOTE — Assessment & Plan Note (Signed)
Patient currently followed by Dr. Sarina Ser pulmonologist.  Maintained on [place] and albuterol as needed.  Continue take medication as prescribed follow-up with pulmonology as recommended.

## 2022-02-11 NOTE — Assessment & Plan Note (Signed)
Patient currently maintained on amlodipine 5 mg.  Blood pressure within normal limits.  Continue take amlodipine 5 mg as prescribed

## 2022-02-11 NOTE — Assessment & Plan Note (Signed)
Continue working on healthy lifestyle modifications.  She has lost some weight since last office visit

## 2022-02-11 NOTE — Assessment & Plan Note (Signed)
Currently followed by Dr. Sarina Ser.  Last CT scan of chest was 07/2021.  Follow-up with pulmonology as recommended

## 2022-02-11 NOTE — Assessment & Plan Note (Signed)
Continue healthy lifestyle modifications.  She has lost weight since last office visit.  Pending lipids today.  Patient was fasting

## 2022-02-12 ENCOUNTER — Encounter: Payer: Self-pay | Admitting: Nurse Practitioner

## 2022-02-12 DIAGNOSIS — E7800 Pure hypercholesterolemia, unspecified: Secondary | ICD-10-CM

## 2022-02-12 DIAGNOSIS — R7303 Prediabetes: Secondary | ICD-10-CM

## 2022-02-12 NOTE — Telephone Encounter (Signed)
Can we call and set up a 6 month fating lab appointment please. Patient is aware. I will place the orders

## 2022-04-09 MED FILL — Budesonide-Glycopyrrolate-Formoterol Aers 160-9-4.8 MCG/ACT: RESPIRATORY_TRACT | 30 days supply | Qty: 10.7 | Fill #3 | Status: CN

## 2022-04-10 ENCOUNTER — Other Ambulatory Visit: Payer: Self-pay

## 2022-04-10 MED FILL — Budesonide-Glycopyrrolate-Formoterol Aers 160-9-4.8 MCG/ACT: RESPIRATORY_TRACT | 30 days supply | Qty: 10.7 | Fill #3 | Status: AC

## 2022-04-27 ENCOUNTER — Telehealth: Payer: Medicare Other | Admitting: Family

## 2022-04-27 DIAGNOSIS — J019 Acute sinusitis, unspecified: Secondary | ICD-10-CM

## 2022-04-27 DIAGNOSIS — J4520 Mild intermittent asthma, uncomplicated: Secondary | ICD-10-CM | POA: Diagnosis not present

## 2022-04-27 MED ORDER — PREDNISONE 10 MG (21) PO TBPK: ORAL_TABLET | ORAL | 0 refills | Status: DC

## 2022-04-27 MED ORDER — BENZONATATE 100 MG PO CAPS: 100.0000 mg | ORAL_CAPSULE | Freq: Three times a day (TID) | ORAL | 0 refills | Status: DC | PRN

## 2022-04-27 MED ORDER — AMOXICILLIN-POT CLAVULANATE 875-125 MG PO TABS: 1.0000 | ORAL_TABLET | Freq: Two times a day (BID) | ORAL | 0 refills | Status: DC

## 2022-04-27 NOTE — Patient Instructions (Signed)

## 2022-04-27 NOTE — Progress Notes (Signed)
Virtual Visit Consent   [name], you are scheduled for a virtual visit with a [facility] provider today. Just as with appointments in the office, your consent must be obtained to participate. Your consent will be active for this visit and any virtual visit you may have with one of our providers in the next 365 days. If you have a MyChart account, a copy of this consent can be sent to you electronically.  As this is a virtual visit, video technology does not allow for your provider to perform a traditional examination. This may limit your provider's ability to fully assess your condition. If your provider identifies any concerns that need to be evaluated in person or the need to arrange testing (such as labs, EKG, etc.), we will make arrangements to do so. Although advances in technology are sophisticated, we cannot ensure that it will always work on either your end or our end. If the connection with a video visit is poor, the visit may have to be switched to a telephone visit. With either a video or telephone visit, we are not always able to ensure that we have a secure connection.  By engaging in this virtual visit, you consent to the provision of healthcare and authorize for your insurance to be billed (if applicable) for the services provided during this visit. Depending on your insurance coverage, you may receive a charge related to this service.  I need to obtain your verbal consent now. Are you willing to proceed with your visit today? Meghan Leon has provided verbal consent on 04/27/2022 for a virtual visit (video or telephone). Evelina Dun, FNP  Date: 04/27/2022 8:39 AM  Virtual Visit via Video Note   I, Evelina Dun, connected with  Meghan Leon  ([id number], Sep 21, 1955) on 04/27/22 at  8:45 AM EDT by a video-enabled telemedicine application and verified that I am speaking with the correct person using two identifiers.  Location: Patient: Virtual Visit Location Patient:  Home Provider: Virtual Visit Location Provider: Home Office   I discussed the limitations of evaluation and management by telemedicine and the availability of in person appointments. The patient expressed understanding and agreed to proceed.    History of Present Illness: Meghan Leon is a 67 y.o. who identifies as a female who was assigned female at birth, and is being seen today for sinus pressure that started over a week ago.  HPI: Sinus Problem This is a new problem. The current episode started 1 to 4 weeks ago. The problem has been gradually worsening since onset. Maximum temperature: 99. Her pain is at a severity of 2/10. The pain is moderate. Associated symptoms include congestion, coughing, headaches, sinus pressure and sneezing. Pertinent negatives include no ear pain. Past treatments include oral decongestants. The treatment provided mild relief.    Problems:  Patient Active Problem List   Diagnosis Date Noted   Preventative health care 02/11/2022   Obesity (BMI 30-39.9) 02/11/2022   Acute pain of right knee 06/03/2021   Sarcoidosis 08/04/2019   Acute pain of left shoulder 08/04/2019   Essential hypertension 04/06/2019   Asthma 04/06/2019   Hyperlipidemia 04/06/2019    Allergies: No Known Allergies Medications:  Current Outpatient Medications:    amoxicillin-clavulanate (AUGMENTIN) 875-125 MG tablet, Take 1 tablet by mouth 2 (two) times daily., Disp: 14 tablet, Rfl: 0   benzonatate (TESSALON PERLES) 100 MG capsule, Take 1 capsule (100 mg total) by mouth 3 (three) times daily as needed., Disp: 20 capsule, Rfl:  0   predniSONE (STERAPRED UNI-PAK 21 TAB) 10 MG (21) TBPK tablet, Use as directed, Disp: 21 tablet, Rfl: 0   albuterol (VENTOLIN HFA) 108 (90 Base) MCG/ACT inhaler, Inhale 2 puffs into the lungs every 6 (six) hours as needed for wheezing or shortness of breath., Disp: 8.5 g, Rfl: 2   amLODipine (NORVASC) 5 MG tablet, Take 1 tablet (5 mg total) by mouth daily., Disp:  90 tablet, Rfl: 1   Budeson-Glycopyrrol-Formoterol (BREZTRI AEROSPHERE) 160-9-4.8 MCG/ACT AERO, Inhale 1 puff into the lungs in the morning and at bedtime, Disp: 10.7 g, Rfl: 7  Observations/Objective: Patient is well-developed, well-nourished in no acute distress.  Resting comfortably  at home.  Head is normocephalic, atraumatic.  No labored breathing.  Speech is clear and coherent with logical content.  Patient is alert and oriented at baseline.  Nasal congestion   Assessment and Plan: 1. Acute sinusitis, recurrence not specified, unspecified location - amoxicillin-clavulanate (AUGMENTIN) 875-125 MG tablet; Take 1 tablet by mouth 2 (two) times daily.  Dispense: 14 tablet; Refill: 0  2. Mild intermittent asthma without complication - predniSONE (STERAPRED UNI-PAK 21 TAB) 10 MG (21) TBPK tablet; Use as directed  Dispense: 21 tablet; Refill: 0 - benzonatate (TESSALON PERLES) 100 MG capsule; Take 1 capsule (100 mg total) by mouth 3 (three) times daily as needed.  Dispense: 20 capsule; Refill: 0  - Take meds as prescribed - Use a cool mist humidifier  -Use saline nose sprays frequently -Force fluids -For any cough or congestion  Use plain Mucinex- regular strength or max strength is fine -For fever or aces or pains- take tylenol or ibuprofen. -Throat lozenges if help -Follow up if symptoms worsen or do not improve   Follow Up Instructions: I discussed the assessment and treatment plan with the patient. The patient was provided an opportunity to ask questions and all were answered. The patient agreed with the plan and demonstrated an understanding of the instructions.  A copy of instructions were sent to the patient via MyChart unless otherwise noted below.     The patient was advised to call back or seek an in-person evaluation if the symptoms worsen or if the condition fails to improve as anticipated.  Time:  I spent 8 minutes with the patient via telehealth technology discussing  the above problems/concerns.    Evelina Dun, FNP

## 2022-04-29 ENCOUNTER — Ambulatory Visit: Payer: Medicare Other | Admitting: Pulmonary Disease

## 2022-04-29 ENCOUNTER — Encounter: Payer: Self-pay | Admitting: Pulmonary Disease

## 2022-04-29 VITALS — BP 124/84 | HR 78 | Temp 97.8°F | Ht 63.26 in | Wt 181.6 lb

## 2022-04-29 DIAGNOSIS — D869 Sarcoidosis, unspecified: Secondary | ICD-10-CM | POA: Diagnosis not present

## 2022-04-29 DIAGNOSIS — J0110 Acute frontal sinusitis, unspecified: Secondary | ICD-10-CM

## 2022-04-29 DIAGNOSIS — J4540 Moderate persistent asthma, uncomplicated: Secondary | ICD-10-CM

## 2022-04-29 DIAGNOSIS — R918 Other nonspecific abnormal finding of lung field: Secondary | ICD-10-CM

## 2022-04-29 NOTE — Progress Notes (Signed)
Subjective:    Patient ID: Meghan Leon, female    DOB: 1955-05-20, 67 y.o.   MRN: [id number] Patient Care Team: Michela Pitcher, NP as PCP - General (Nurse Practitioner)  Chief Complaint  Patient presents with   Follow-up    No SOB or wheezing. Dry cough. Sinus infection, started medications on Sunday.    HPI Meghan Leon is a 67 year old lifelong never smoker with a history of moderate persistent asthma and sarcoidosis.  She follows here for the same.  This is a scheduled visit.  Last visit here was on 05 Jul 2021, she was doing well at that time and a follow-up CT chest was ordered. She is on Breztri due to loss of coverage for Advair HFA and Symbicort.  She cannot tolerate powdered medications.  She was seen via virtual visit by primary care on 10 March due to sinusitis symptoms.  She did not develop shortness of breath with the symptoms.  She was started on Augmentin and prednisone taper and feels markedly improved.  She has not had any wheezing, chest pain or cough or sinus symptoms..  She has not needed to use albuterol lately.  She is compliant with Breztri.  She does not endorse any other symptomatology.   DATA: 07/19/2019 PFTs: FEV1 1.19 L or 60% predicted, FVC 1.78 L or 70% predicted.  Postbronchodilator there is a 21% net change on FEV1 to 1.45 L or 73% predicted and equally on 19% change in FVC demonstrating decreased air trapping.  Lung volumes are mildly reduced consistent with the patient's sarcoidosis.  Diffusion capacity mildly reduced when alveolar volume is taken into account. 07/19/2019 CT scan of the chest:  shows calcified mediastinal lymphadenopathy consistent with sarcoidosis and fibrotic changes on the right upper lobe clustered perilymphatic and perifissural nodules consistent with sarcoidosis.  The findings are stable. 01/19/2020 CT scan of the chest: Changes of sarcoidosis with one area slightly enlarged however still consistent with sarcoidosis. 08/10/2020 CT chest  without contrast: Persistent changes consistent with sarcoidosis. 08/12/2021 chest CT: Mediastinal/hilar and pulmonary parenchymal findings of sarcoid with very mild progression in the lungs from 08/10/2020, enlarged pulmonary trunk indicative of pulmonary arterial hypertension. 09/11/2021 echocardiogram: LVEF 55 to 60%, grade 1 DD, normal RV function.  Trivial mitral valve regurgitation, no aortic stenosis.  No evidence of pulmonary hypertension.   Review of Systems A 10 point review of systems was performed and it is as noted above otherwise negative.  Patient Active Problem List   Diagnosis Date Noted   Preventative health care 02/11/2022   Obesity (BMI 30-39.9) 02/11/2022   Acute pain of right knee 06/03/2021   Sarcoidosis 08/04/2019   Acute pain of left shoulder 08/04/2019   Essential hypertension 04/06/2019   Asthma 04/06/2019   Hyperlipidemia 04/06/2019   Social History   Tobacco Use   Smoking status: Never   Smokeless tobacco: Never   Tobacco comments:    never  Substance Use Topics   Alcohol use: No   No Known Allergies  Current Meds  Medication Sig   albuterol (VENTOLIN HFA) 108 (90 Base) MCG/ACT inhaler Inhale 2 puffs into the lungs every 6 (six) hours as needed for wheezing or shortness of breath.   amLODipine (NORVASC) 5 MG tablet Take 1 tablet (5 mg total) by mouth daily.   amoxicillin-clavulanate (AUGMENTIN) 875-125 MG tablet Take 1 tablet by mouth 2 (two) times daily.   Budeson-Glycopyrrol-Formoterol (BREZTRI AEROSPHERE) 160-9-4.8 MCG/ACT AERO Inhale 1 puff into the lungs in the morning and at  bedtime   predniSONE (STERAPRED UNI-PAK 21 TAB) 10 MG (21) TBPK tablet Use as directed   Immunization History  Administered Date(s) Administered   Fluad Quad(high Dose 65+) 12/12/2021   Influenza-Unspecified 12/08/2018, 12/15/2019   PFIZER(Purple Top)SARS-COV-2 Vaccination 03/11/2019, 04/01/2019, 02/07/2020   PNEUMOCOCCAL CONJUGATE-20 02/11/2022   Pfizer Covid-19  Vaccine Bivalent Booster 52yr & up 03/22/2021   Tdap 06/03/2005, 11/13/2020   Zoster Recombinat (Shingrix) 11/09/2019, 01/18/2020       Objective:   Physical Exam BP 124/84 (BP Location: Left Arm, Cuff Size: Normal)   Pulse 78   Temp 97.8 F (36.6 C)   Ht 5' 3.26" (1.607 m)   Wt 181 lb 9.6 oz (82.4 kg)   SpO2 97%   BMI 31.91 kg/m   SpO2: 97 % O2 Device: None (Room air)  GENERAL: Awake, alert, fully ambulatory.  No respiratory distress.  No conversational dyspnea. HEAD: Normocephalic, atraumatic.  EYES: Pupils equal, round, reactive to light.  No scleral icterus.  MOUTH: Nose/mouth/throat not examined due to masking requirements for COVID 19. NECK: Supple. No thyromegaly. Trachea midline. No JVD.  No adenopathy. PULMONARY: Good air entry bilaterally.  Very mild end expiratory wheezes noted. CARDIOVASCULAR: S1 and S2. Regular rate and rhythm.  No rubs murmurs or gallops heard. GASTROINTESTINAL: No abdominal distention noted. MUSCULOSKELETAL: No joint deformity, no clubbing, no edema.  NEUROLOGIC: No focal deficit, no gait disturbance, speech is fluent. SKIN: Intact,warm,dry.  Limited exam shows no rashes. PSYCH: Mood and behavior normal       Assessment & Plan:     ICD-10-CM   1. Moderate persistent asthma in adult without complication  [id number]   Continue Breztri and as needed albuterol Well compensated    2. Acute frontal sinusitis, recurrence not specified  J01.10    Started antibiotics and prednisone 3/10 Improving clinically    3. Sarcoidosis  D86.9 CT Chest Wo Contrast   Remains asymptomatic in this regard Follow-up CT chest June/July    4. Abnormal findings on diagnostic imaging of lung  R91.8 CT Chest Wo Contrast   Findings are consistent with sarcoidosis Follow-up CT as above     Orders Placed This Encounter  Procedures   CT Chest Wo Contrast    End of June    Standing Status:   Future    Standing Expiration Date:   04/29/2023    Order Specific  Question:   Preferred imaging location?    Answer:    [facility]   Will see the patient in follow-up in 6 months time however we will notify her of the results of chest CT once these are available.  CRenold Don MD Advanced Bronchoscopy PCCM [facility]    *This note was dictated using voice recognition software/Dragon.  Despite best efforts to proofread, errors can occur which can change the meaning. Any transcriptional errors that result from this process are unintentional and may not be fully corrected at the time of dictation.

## 2022-04-29 NOTE — Patient Instructions (Signed)
We will repeat another CT scan end of June/beginning of July.  We will see her in follow-up in 6 months time call sooner should any new problems arise.  Will call you with the results of the CT scan once these are available.

## 2022-05-03 ENCOUNTER — Other Ambulatory Visit: Payer: Self-pay

## 2022-05-05 ENCOUNTER — Other Ambulatory Visit: Payer: Self-pay

## 2022-05-14 ENCOUNTER — Other Ambulatory Visit: Payer: Self-pay

## 2022-05-14 ENCOUNTER — Telehealth: Payer: Medicare Other | Admitting: Physician Assistant

## 2022-05-14 DIAGNOSIS — R0982 Postnasal drip: Secondary | ICD-10-CM | POA: Diagnosis not present

## 2022-05-14 DIAGNOSIS — R12 Heartburn: Secondary | ICD-10-CM | POA: Diagnosis not present

## 2022-05-14 MED ORDER — LEVOCETIRIZINE DIHYDROCHLORIDE 5 MG PO TABS
5.0000 mg | ORAL_TABLET | Freq: Every evening | ORAL | 1 refills | Status: DC
  Filled 2022-05-14: qty 30, 30d supply, fill #0

## 2022-05-14 MED ORDER — FAMOTIDINE 20 MG PO TABS
20.0000 mg | ORAL_TABLET | Freq: Two times a day (BID) | ORAL | 0 refills | Status: DC
  Filled 2022-05-14: qty 28, 14d supply, fill #0

## 2022-05-14 MED ORDER — FLUTICASONE PROPIONATE 50 MCG/ACT NA SUSP
2.0000 | Freq: Every day | NASAL | 6 refills | Status: DC
  Filled 2022-05-14: qty 16, 30d supply, fill #0

## 2022-05-14 NOTE — Patient Instructions (Signed)
Meghan Leon, thank you for joining [name], PA-C for today's virtual visit.  While this provider is not your primary care provider (PCP), if your PCP is located in our provider database this encounter information will be shared with them immediately following your visit.   [facility] account gives you access to today's visit and all your visits, tests, and labs performed at [facility] " click here if you don't have a [facility] account or go to [email or web address]  Consent: (Patient) [name] provided verbal consent for this virtual visit at the beginning of the encounter.  Current Medications:  Current Outpatient Medications:    famotidine (PEPCID) 20 MG tablet, Take 1 tablet (20 mg total) by mouth 2 (two) times daily., Disp: 28 tablet, Rfl: 0   fluticasone (FLONASE) 50 MCG/ACT nasal spray, Place 2 sprays into both nostrils daily., Disp: 16 g, Rfl: 6   levocetirizine (XYZAL) 5 MG tablet, Take 1 tablet (5 mg total) by mouth every evening., Disp: 30 tablet, Rfl: 1   albuterol (VENTOLIN HFA) 108 (90 Base) MCG/ACT inhaler, Inhale 2 puffs into the lungs every 6 (six) hours as needed for wheezing or shortness of breath., Disp: 8.5 g, Rfl: 2   amLODipine (NORVASC) 5 MG tablet, Take 1 tablet (5 mg total) by mouth daily., Disp: 90 tablet, Rfl: 1   Budeson-Glycopyrrol-Formoterol (BREZTRI AEROSPHERE) 160-9-4.8 MCG/ACT AERO, Inhale 1 puff into the lungs in the morning and at bedtime, Disp: 10.7 g, Rfl: 7   Medications ordered in this encounter:  Meds ordered this encounter  Medications   fluticasone (FLONASE) 50 MCG/ACT nasal spray    Sig: Place 2 sprays into both nostrils daily.    Dispense:  16 g    Refill:  6    Order Specific Question:   Supervising Provider    Answer:   Chase Picket [id number]   levocetirizine (XYZAL) 5 MG tablet    Sig: Take 1 tablet (5 mg total) by mouth every evening.    Dispense:  30 tablet    Refill:  1     Order Specific Question:   Supervising Provider    Answer:   Chase Picket [id number]   famotidine (PEPCID) 20 MG tablet    Sig: Take 1 tablet (20 mg total) by mouth 2 (two) times daily.    Dispense:  28 tablet    Refill:  0    Order Specific Question:   Supervising Provider    Answer:   Chase Picket [id number]     *If you need refills on other medications prior to your next appointment, please contact your pharmacy*  Follow-Up: Call back or seek an in-person evaluation if the symptoms worsen or if the condition fails to improve as anticipated.  [facility] [phone]  Other Instructions Please keep hydrated. Avoid late night eating and follow dietary recommendations below Ok to continue OTC Mucinex and cough medication. Start the Xyzal once daily. Start using the Flonase once daily after your first nasal rinse of the day.  I want you to start the Famotidine twice daily for the rest of this week. If symptoms are not easing up and resolving within next week, or any new or worsening symptoms, you need an in-person evaluation.   Food Choices for Gastroesophageal Reflux Disease, Adult When you have gastroesophageal reflux disease (GERD), the foods you eat and your eating habits are very important. Choosing the right foods can help ease your  discomfort. Think about working with a food expert (dietitian) to help you make good choices. What are tips for following this plan? Reading food labels Look for foods that are low in saturated fat. Foods that may help with your symptoms include: Foods that have less than 5% of daily value (DV) of fat. Foods that have 0 grams of trans fat. Cooking Do not fry your food. Cook your food by baking, steaming, grilling, or broiling. These are all methods that do not need a lot of fat for cooking. To add flavor, try to use herbs that are low in spice and acidity. Meal planning  Choose healthy foods that are low in fat, such  as: Fruits and vegetables. Whole grains. Low-fat dairy products. Lean meats, fish, and poultry. Eat small meals often instead of eating 3 large meals each day. Eat your meals slowly in a place where you are relaxed. Avoid bending over or lying down until 2-3 hours after eating. Limit high-fat foods such as fatty meats or fried foods. Limit your intake of fatty foods, such as oils, butter, and shortening. Avoid the following as told by your doctor: Foods that cause symptoms. These may be different for different people. Keep a food diary to keep track of foods that cause symptoms. Alcohol. Drinking a lot of liquid with meals. Eating meals during the 2-3 hours before bed. Lifestyle Stay at a healthy weight. Ask your doctor what weight is healthy for you. If you need to lose weight, work with your doctor to do so safely. Exercise for at least 30 minutes on 5 or more days each week, or as told by your doctor. Wear loose-fitting clothes. Do not smoke or use any products that contain nicotine or tobacco. If you need help quitting, ask your doctor. Sleep with the head of your bed higher than your feet. Use a wedge under the mattress or blocks under the bed frame to raise the head of the bed. Chew sugar-free gum after meals. What foods should eat?  Eat a healthy, well-balanced diet of fruits, vegetables, whole grains, low-fat dairy products, lean meats, fish, and poultry. Each person is different. Foods that may cause symptoms in one person may not cause any symptoms in another person. Work with your doctor to find foods that are safe for you. The items listed above may not be a complete list of what you can eat and drink. Contact a food expert for more options. What foods should I avoid? Limiting some of these foods may help in managing the symptoms of GERD. Everyone is different. Talk with a food expert or your doctor to help you find the exact foods to avoid, if any. Fruits Any fruits prepared  with added fat. Any fruits that cause symptoms. For some people, this may include citrus fruits, such as oranges, grapefruit, pineapple, and lemons. Vegetables Deep-fried vegetables. [place] fries. Any vegetables prepared with added fat. Any vegetables that cause symptoms. For some people, this may include tomatoes and tomato products, chili peppers, onions and garlic, and horseradish. Grains Pastries or quick breads with added fat. Meats and other proteins High-fat meats, such as fatty beef or pork, hot dogs, ribs, ham, sausage, salami, and bacon. Fried meat or protein, including fried fish and fried chicken. Nuts and nut butters, in large amounts. Dairy Whole milk and chocolate milk. Sour cream. Cream. Ice cream. Cream cheese. Milkshakes. Fats and oils Butter. Margarine. Shortening. Ghee. Beverages Coffee and tea, with or without caffeine. Carbonated beverages. Sodas. Energy drinks.  Fruit juice made with acidic fruits, such as orange or grapefruit. Tomato juice. Alcoholic drinks. Sweets and desserts Chocolate and cocoa. Donuts. Seasonings and condiments Pepper. Peppermint and spearmint. Added salt. Any condiments, herbs, or seasonings that cause symptoms. For some people, this may include curry, hot sauce, or vinegar-based salad dressings. The items listed above may not be a complete list of what you should not eat and drink. Contact a food expert for more options. Questions to ask your doctor Diet and lifestyle changes are often the first steps that are taken to manage symptoms of GERD. If diet and lifestyle changes do not help, talk with your doctor about taking medicines. Where to find more information International Foundation for Gastrointestinal Disorders: aboutgerd.org Summary When you have GERD, food and lifestyle choices are very important in easing your symptoms. Eat small meals often instead of 3 large meals a day. Eat your meals slowly and in a place where you are relaxed. Avoid  bending over or lying down until 2-3 hours after eating. Limit high-fat foods such as fatty meats or fried foods. This information is not intended to replace advice given to you by your health care provider. Make sure you discuss any questions you have with your health care provider. Document Revised: 08/15/2019 Document Reviewed: 08/15/2019 Elsevier Patient Education  [place].    If you have been instructed to have an in-person evaluation today at a local Urgent Care facility, please use the link below. It will take you to a list of all of our available [facility], including address, phone number and hours of operation. Please do not delay care.  [facility]  If you or a family member do not have a primary care provider, use the link below to schedule a visit and establish care. When you choose a [facility] primary care physician or advanced practice provider, you gain a long-term partner in health. Find a Primary Care Provider  Learn more about [facility] in-office and virtual care options: [facility]

## 2022-05-14 NOTE — Progress Notes (Signed)
Virtual Visit Consent   Meghan Leon, you are scheduled for a virtual visit with a [facility] provider today. Just as with appointments in the office, your consent must be obtained to participate. Your consent will be active for this visit and any virtual visit you may have with one of our providers in the next 365 days. If you have a MyChart account, a copy of this consent can be sent to you electronically.  As this is a virtual visit, video technology does not allow for your provider to perform a traditional examination. This may limit your provider's ability to fully assess your condition. If your provider identifies any concerns that need to be evaluated in person or the need to arrange testing (such as labs, EKG, etc.), we will make arrangements to do so. Although advances in technology are sophisticated, we cannot ensure that it will always work on either your end or our end. If the connection with a video visit is poor, the visit may have to be switched to a telephone visit. With either a video or telephone visit, we are not always able to ensure that we have a secure connection.  By engaging in this virtual visit, you consent to the provision of healthcare and authorize for your insurance to be billed (if applicable) for the services provided during this visit. Depending on your insurance coverage, you may receive a charge related to this service.  I need to obtain your verbal consent now. Are you willing to proceed with your visit today? Meghan Leon has provided verbal consent on 05/14/2022 for a virtual visit (video or telephone). Meghan Leon, [place]  Date: 05/14/2022 12:21 PM  Virtual Visit via Video Note   I, Meghan Leon, connected with  MARQUASIA VANGINKEL  ([id number], May 01, 1955) on 05/14/22 at 12:15 PM EDT by a video-enabled telemedicine application and verified that I am speaking with the correct person using two identifiers.  Location: Patient: Virtual Visit  Location Patient: Home Provider: Virtual Visit Location Provider: Home Office   I discussed the limitations of evaluation and management by telemedicine and the availability of in person appointments. The patient expressed understanding and agreed to proceed.    History of Present Illness: Meghan Leon is a 67 y.o. who identifies as a female who was assigned female at birth, and is being seen today for residual post-nasal drip and cough after recent treatment for sinusitis. Was seen on 3/10 and treated for acute bacterial sinusitis with course of Augmentin and prednisone. Notes resolution of sinus pain, pressure with this course. Still having some lingering clear PND despite OTC medications and continued dry cough, worse at night. Has noted some associated episodes of heartburn and nausea since dealing with the drainage. Denies chest pain, abdominal pain or SOB. Not having to use the albuterol inhaler.  OTC -- Mucinex, OTC allergy medication (cannot remember the name)  HPI: HPI  Problems:  Patient Active Problem List   Diagnosis Date Noted   Preventative health care 02/11/2022   Obesity (BMI 30-39.9) 02/11/2022   Acute pain of right knee 06/03/2021   Sarcoidosis 08/04/2019   Acute pain of left shoulder 08/04/2019   Essential hypertension 04/06/2019   Asthma 04/06/2019   Hyperlipidemia 04/06/2019    Allergies: No Known Allergies Medications:  Current Outpatient Medications:    famotidine (PEPCID) 20 MG tablet, Take 1 tablet (20 mg total) by mouth 2 (two) times daily., Disp: 28 tablet, Rfl: 0   fluticasone (FLONASE) 50 MCG/ACT  nasal spray, Place 2 sprays into both nostrils daily., Disp: 16 g, Rfl: 6   levocetirizine (XYZAL) 5 MG tablet, Take 1 tablet (5 mg total) by mouth every evening., Disp: 30 tablet, Rfl: 1   albuterol (VENTOLIN HFA) 108 (90 Base) MCG/ACT inhaler, Inhale 2 puffs into the lungs every 6 (six) hours as needed for wheezing or shortness of breath., Disp: 8.5 g, Rfl: 2    amLODipine (NORVASC) 5 MG tablet, Take 1 tablet (5 mg total) by mouth daily., Disp: 90 tablet, Rfl: 1   Budeson-Glycopyrrol-Formoterol (BREZTRI AEROSPHERE) 160-9-4.8 MCG/ACT AERO, Inhale 1 puff into the lungs in the morning and at bedtime, Disp: 10.7 g, Rfl: 7  Observations/Objective: Patient is well-developed, well-nourished in no acute distress.  Resting comfortably  at home.  Head is normocephalic, atraumatic.  No labored breathing.  Speech is clear and coherent with logical content.  Patient is alert and oriented at baseline.    Assessment and Plan: 1. Post-nasal drip - fluticasone (FLONASE) 50 MCG/ACT nasal spray; Place 2 sprays into both nostrils daily.  Dispense: 16 g; Refill: 6 - levocetirizine (XYZAL) 5 MG tablet; Take 1 tablet (5 mg total) by mouth every evening.  Dispense: 30 tablet; Refill: 1  2. Heartburn - famotidine (PEPCID) 20 MG tablet; Take 1 tablet (20 mg total) by mouth 2 (two) times daily.  Dispense: 28 tablet; Refill: 0  Lingering post-nasal drip after recent sinusitis, now with some secondary reflux, all contributing to her dry cough. Supportive measures and OTC medications reviewed with patient. Will start trial of Fluticasone and Xyzal once daily with Famotidine BID for the rest of the week to calm reflux. Ok to continue Delsym or Mucinex-DM OTC along with this. PCP follow-up discussed.   Follow Up Instructions: I discussed the assessment and treatment plan with the patient. The patient was provided an opportunity to ask questions and all were answered. The patient agreed with the plan and demonstrated an understanding of the instructions.  A copy of instructions were sent to the patient via MyChart unless otherwise noted below.   The patient was advised to call back or seek an in-person evaluation if the symptoms worsen or if the condition fails to improve as anticipated.  Time:  I spent 12 minutes with the patient via telehealth technology discussing the above  problems/concerns.    Meghan Rio, PA-C

## 2022-08-09 ENCOUNTER — Other Ambulatory Visit: Payer: Self-pay | Admitting: Family

## 2022-08-11 ENCOUNTER — Other Ambulatory Visit: Payer: Self-pay | Admitting: Nurse Practitioner

## 2022-08-11 ENCOUNTER — Other Ambulatory Visit: Payer: Self-pay

## 2022-08-12 ENCOUNTER — Other Ambulatory Visit: Payer: Self-pay

## 2022-08-12 MED FILL — Amlodipine Besylate Tab 5 MG (Base Equivalent): ORAL | 90 days supply | Qty: 90 | Fill #0 | Status: AC

## 2022-08-14 ENCOUNTER — Other Ambulatory Visit: Payer: Self-pay

## 2022-08-14 ENCOUNTER — Other Ambulatory Visit (INDEPENDENT_AMBULATORY_CARE_PROVIDER_SITE_OTHER): Payer: Medicare Other

## 2022-08-14 DIAGNOSIS — R7303 Prediabetes: Secondary | ICD-10-CM

## 2022-08-14 DIAGNOSIS — E7800 Pure hypercholesterolemia, unspecified: Secondary | ICD-10-CM

## 2022-08-14 LAB — LIPID PANEL
Cholesterol: 231 mg/dL — ABNORMAL HIGH (ref 0–200)
HDL: 41.8 mg/dL (ref >39.00)
LDL Cholesterol: 167 mg/dL — ABNORMAL HIGH (ref 0–99)
NonHDL: 188.93
Total CHOL/HDL Ratio: 6
Triglycerides: 112 mg/dL (ref 0.0–149.0)
VLDL: 22.4 mg/dL (ref 0.0–40.0)

## 2022-08-14 LAB — HEMOGLOBIN A1C: Hgb A1c MFr Bld: 6.2 % (ref 4.6–6.5)

## 2022-08-19 ENCOUNTER — Ambulatory Visit
Admission: RE | Admit: 2022-08-19 | Discharge: 2022-08-19 | Disposition: A | Discharge status: Home / Self Care | Payer: Medicare Other | Source: Ambulatory Visit | Attending: Pulmonary Disease | Admitting: Pulmonary Disease

## 2022-08-19 DIAGNOSIS — D869 Sarcoidosis, unspecified: Secondary | ICD-10-CM | POA: Insufficient documentation

## 2022-08-19 DIAGNOSIS — R918 Other nonspecific abnormal finding of lung field: Secondary | ICD-10-CM | POA: Insufficient documentation

## 2022-08-19 DIAGNOSIS — R590 Localized enlarged lymph nodes: Secondary | ICD-10-CM | POA: Diagnosis not present

## 2022-08-19 DIAGNOSIS — I700 Atherosclerosis of aorta: Secondary | ICD-10-CM | POA: Diagnosis not present

## 2022-08-25 ENCOUNTER — Other Ambulatory Visit: Payer: Self-pay

## 2022-08-25 DIAGNOSIS — D869 Sarcoidosis, unspecified: Secondary | ICD-10-CM

## 2022-10-25 ENCOUNTER — Encounter: Payer: Self-pay | Admitting: Pulmonary Disease

## 2022-11-16 MED FILL — Amlodipine Besylate Tab 5 MG (Base Equivalent): ORAL | 90 days supply | Qty: 90 | Fill #1 | Status: AC

## 2022-11-17 ENCOUNTER — Other Ambulatory Visit: Payer: Self-pay | Admitting: Nurse Practitioner

## 2022-11-17 DIAGNOSIS — Z1231 Encounter for screening mammogram for malignant neoplasm of breast: Secondary | ICD-10-CM

## 2022-11-18 DIAGNOSIS — H5213 Myopia, bilateral: Secondary | ICD-10-CM | POA: Diagnosis not present

## 2022-11-18 DIAGNOSIS — H524 Presbyopia: Secondary | ICD-10-CM | POA: Diagnosis not present

## 2022-11-20 ENCOUNTER — Ambulatory Visit: Payer: Medicare Other | Attending: Pulmonary Disease

## 2022-11-20 DIAGNOSIS — R0602 Shortness of breath: Secondary | ICD-10-CM | POA: Insufficient documentation

## 2022-11-20 DIAGNOSIS — D869 Sarcoidosis, unspecified: Secondary | ICD-10-CM | POA: Diagnosis not present

## 2022-11-20 DIAGNOSIS — J4540 Moderate persistent asthma, uncomplicated: Secondary | ICD-10-CM | POA: Diagnosis not present

## 2022-11-20 LAB — PULMONARY FUNCTION TEST ARMC ONLY
DL/VA % pred: 91 %
DL/VA: 3.81 ml/min/mmHg/L
DLCO unc % pred: 70 %
DLCO unc: 13.7 ml/min/mmHg
FEF 25-75 Post: 0.98 L/s
FEF 25-75 Pre: 0.91 L/s
FEF2575-%Change-Post: 7 %
FEF2575-%Pred-Post: 48 %
FEF2575-%Pred-Pre: 45 %
FEV1-%Change-Post: 5 %
FEV1-%Pred-Post: 60 %
FEV1-%Pred-Pre: 57 %
FEV1-Post: 1.41 L
FEV1-Pre: 1.33 L
FEV1FVC-%Change-Post: 0 %
FEV1FVC-%Pred-Pre: 89 %
FEV6-%Change-Post: 13 %
FEV6-%Pred-Post: 71 %
FEV6-%Pred-Pre: 63 %
FEV6-Post: 2.08 L
FEV6-Pre: 1.84 L
FEV6FVC-%Pred-Post: 104 %
FEV6FVC-%Pred-Pre: 104 %
FVC-%Change-Post: 6 %
FVC-%Pred-Post: 68 %
FVC-%Pred-Pre: 64 %
FVC-Post: 2.08 L
FVC-Pre: 1.95 L
Post FEV1/FVC ratio: 68 %
Post FEV6/FVC ratio: 100 %
Pre FEV1/FVC ratio: 68 %
Pre FEV6/FVC Ratio: 100 %
RV % pred: 92 %
RV: 1.95 L
TLC % pred: 83 %
TLC: 4.17 L

## 2022-11-20 MED ORDER — ALBUTEROL SULFATE (2.5 MG/3ML) 0.083% IN NEBU
2.5000 mg | INHALATION_SOLUTION | Freq: Once | RESPIRATORY_TRACT | Status: AC
  Administered 2022-11-20: 2.5 mg via RESPIRATORY_TRACT
  Filled 2022-11-20: qty 3

## 2022-11-24 ENCOUNTER — Other Ambulatory Visit
Admission: RE | Admit: 2022-11-24 | Discharge: 2022-11-24 | Disposition: A | Discharge status: Home / Self Care | Payer: Medicare Other | Source: Ambulatory Visit | Attending: Pulmonary Disease | Admitting: Pulmonary Disease

## 2022-11-24 ENCOUNTER — Ambulatory Visit: Payer: Medicare Other | Admitting: Pulmonary Disease

## 2022-11-24 ENCOUNTER — Encounter: Payer: Self-pay | Admitting: Pulmonary Disease

## 2022-11-24 ENCOUNTER — Other Ambulatory Visit: Payer: Self-pay

## 2022-11-24 VITALS — BP 120/82 | HR 102 | Temp 97.7°F | Ht 63.26 in | Wt 176.2 lb

## 2022-11-24 DIAGNOSIS — J4541 Moderate persistent asthma with (acute) exacerbation: Secondary | ICD-10-CM

## 2022-11-24 DIAGNOSIS — D869 Sarcoidosis, unspecified: Secondary | ICD-10-CM

## 2022-11-24 DIAGNOSIS — R0602 Shortness of breath: Secondary | ICD-10-CM | POA: Diagnosis not present

## 2022-11-24 DIAGNOSIS — J4540 Moderate persistent asthma, uncomplicated: Secondary | ICD-10-CM | POA: Diagnosis not present

## 2022-11-24 LAB — CBC WITH DIFFERENTIAL/PLATELET
Abs Immature Granulocytes: 0.04 10*3/uL (ref 0.00–0.07)
Basophils Absolute: 0.1 10*3/uL (ref 0.0–0.1)
Basophils Relative: 1 %
Eosinophils Absolute: 0.3 10*3/uL (ref 0.0–0.5)
Eosinophils Relative: 3 %
HCT: 44.7 % (ref 36.0–46.0)
Hemoglobin: 14.2 g/dL (ref 12.0–15.0)
Immature Granulocytes: 0 %
Lymphocytes Relative: 34 %
Lymphs Abs: 3.2 10*3/uL (ref 0.7–4.0)
MCH: 24.4 pg — ABNORMAL LOW (ref 26.0–34.0)
MCHC: 31.8 g/dL (ref 30.0–36.0)
MCV: 76.7 fL — ABNORMAL LOW (ref 80.0–100.0)
Monocytes Absolute: 0.6 10*3/uL (ref 0.1–1.0)
Monocytes Relative: 6 %
Neutro Abs: 5.1 10*3/uL (ref 1.7–7.7)
Neutrophils Relative %: 56 %
Platelets: 372 10*3/uL (ref 150–400)
RBC: 5.83 MIL/uL — ABNORMAL HIGH (ref 3.87–5.11)
RDW: 15 % (ref 11.5–15.5)
WBC: 9.2 10*3/uL (ref 4.0–10.5)
nRBC: 0 % (ref 0.0–0.2)

## 2022-11-24 LAB — NITRIC OXIDE: Nitric Oxide: 39

## 2022-11-24 MED ORDER — METHYLPREDNISOLONE 4 MG PO TBPK
ORAL_TABLET | ORAL | 0 refills | Status: DC
  Filled 2022-11-24: qty 21, 6d supply, fill #0

## 2022-11-24 MED ORDER — BREZTRI AEROSPHERE 160-9-4.8 MCG/ACT IN AERO
INHALATION_SPRAY | RESPIRATORY_TRACT | 11 refills | Status: DC
  Filled 2022-11-24: qty 10.7, 30d supply, fill #0

## 2022-11-24 NOTE — Progress Notes (Signed)
Subjective:    Patient ID: Meghan Leon, female    DOB: Jul 07, 1955, 67 y.o.   MRN: [id number]  Patient Care Team: Eden Emms, NP as PCP - General (Nurse Practitioner)  Chief Complaint  Patient presents with   Follow-up    Occasional DOE. Dry cough. No wheezing.    HPI Meghan Leon is a 67 year old lifelong never smoker with a history of moderate persistent asthma and sarcoidosis.  She follows here for the same.  This is a scheduled visit.  Last visit here was on 29 April 2022, at that time she was treated for an acute frontal sinusitis.  This issue resolved with the interventions prescribed at that time.  She had follow-up CT scan of the chest on 19 August 2022 that did not show any significant change from her baseline sarcoidosis changes.  She is on Breztri due to loss of coverage for Advair HFA and Symbicort.  She cannot tolerate powdered medications.  Over the last few days she has had some increased in dry cough.  No perceived wheezing.  Occasionally she will notice dyspnea on exertion but for the most part this is not an issue.  He has not had any chest pain, lower extremity edema nor calf tenderness.  She has not needed to use albuterol lately but does note that she has not been using it as prescribed as she was not sure what "emergency" inhaler meant.  She now understands the proper use of albuterol.  She is compliant with Breztri.  She does note cough is aggravated by carbonated drinks and some foods.  She has declined to take H2 blockers or PPIs because she does not want to take any more medications.  She has established antireflux measures and has noted foods to avoid help with this.  She notes that cough has been improved with these measures.  She has not had any fevers, chills or sweats.  No weight loss or anorexia.  Otherwise she feels well and looks well.  DATA 19 July 2019 PFTs: FEV1 1.19 L or 60% predicted, FVC 1.78 L or 70% predicted.  Postbronchodilator there is a 21% net change on  FEV1 to 1.45 L or 73% predicted and equally on 19% change in FVC demonstrating decreased air trapping.  Lung volumes are mildly reduced consistent with the patient's sarcoidosis.  Diffusion capacity mildly reduced when alveolar volume is taken into account. 19 July 2019 CT scan of the chest:  shows calcified mediastinal lymphadenopathy consistent with sarcoidosis and fibrotic changes on the right upper lobe clustered perilymphatic and perifissural nodules consistent with sarcoidosis.  The findings are stable. 19 January 2020 CT scan of the chest: Changes of sarcoidosis with one area slightly enlarged however still consistent with sarcoidosis. 08/10/2020 CT chest without contrast: Persistent changes consistent with sarcoidosis. 08/12/2021 CT chest without contrast mediastinal/hilar pulmonary parenchymal findings of sarcoid with mild progression in the lungs from CT of 08/10/2020 enlarged pulmonary trunk consistent pulmonary arterial hypertension. 09/11/2021 echocardiogram: LVEF 55 to 60% grade 1 DD.  Trivial mitral valve regurgitation, thickening of the aortic valve without aortic stenosis. 08/19/2022 CT chest without contrast: Redemonstrated findings consistent with pulmonary sarcoidosis involving confluent interstitial disease in the right upper lobe and superior right lower lobe.  Unchanged perilymphatic nodularity.  Enlarged calcified mediastinal and bilateral hilar lymph nodes unchanged when compared to prior exam consistent with sarcoidosis. 11/20/2022 PFTs: FEV1 1.33 L or 57% predicted, FVC 1.95 L or 64% predicted, FEV1/FVC 68%, lung volumes normal to mildly reduced, diffusion  capacity mildly reduced but corrects for alveolar volume.  Overall no significant change when compared to study performed 19 July 2019.  Review of Systems A 10 point review of systems was performed and it is as noted above otherwise negative.   Patient Active Problem List   Diagnosis Date Noted   Preventative health care  02/11/2022   Obesity (BMI 30-39.9) 02/11/2022   Acute pain of right knee 06/03/2021   Sarcoidosis 08/04/2019   Acute pain of left shoulder 08/04/2019   Essential hypertension 04/06/2019   Asthma 04/06/2019   Hyperlipidemia 04/06/2019    Social History   Tobacco Use   Smoking status: Never   Smokeless tobacco: Never   Tobacco comments:    never  Substance Use Topics   Alcohol use: No    No Known Allergies  Current Meds  Medication Sig   albuterol (VENTOLIN HFA) 108 (90 Base) MCG/ACT inhaler Inhale 2 puffs into the lungs every 6 (six) hours as needed for wheezing or shortness of breath.   amLODipine (NORVASC) 5 MG tablet Take 1 tablet (5 mg total) by mouth daily.   Budeson-Glycopyrrol-Formoterol (BREZTRI AEROSPHERE) 160-9-4.8 MCG/ACT AERO Inhale 1 puff into the lungs in the morning and at bedtime   methylPREDNISolone (MEDROL DOSEPAK) 4 MG TBPK tablet Take by mouth as directed in the package.  Discussed the taper pack.    Immunization History  Administered Date(s) Administered   Fluad Quad(high Dose 65+) 12/12/2021   Influenza-Unspecified 12/08/2018, 12/15/2019   PFIZER(Purple Top)SARS-COV-2 Vaccination 03/11/2019, 04/01/2019, 02/07/2020   PNEUMOCOCCAL CONJUGATE-20 02/11/2022   Pfizer Covid-19 Vaccine Bivalent Booster 82yrs & up 03/22/2021   Tdap 06/03/2005, 11/13/2020   Zoster Recombinant(Shingrix) 11/09/2019, 01/18/2020      Objective:     BP 120/82 (BP Location: Right Arm, Cuff Size: Normal)   Pulse (!) 102   Temp 97.7 F (36.5 C)   Ht 5' 3.26" (1.607 m)   Wt 176 lb 3.2 oz (79.9 kg)   SpO2 97%   BMI 30.96 kg/m   SpO2: 97 % O2 Device: None (Room air)  GENERAL: Awake, alert, fully ambulatory.  No respiratory distress.  No conversational dyspnea. HEAD: Normocephalic, atraumatic.  EYES: Pupils equal, round, reactive to light.  No scleral icterus.  MOUTH: Nose/mouth/throat not examined due to masking requirements for COVID 19. NECK: Supple. No thyromegaly.  Trachea midline. No JVD.  No adenopathy. PULMONARY: Good air entry bilaterally.  Scattered end expiratory wheezes noted. CARDIOVASCULAR: S1 and S2. Regular rate and rhythm.  No rubs murmurs or gallops heard. GASTROINTESTINAL: No abdominal distention noted. MUSCULOSKELETAL: No joint deformity, no clubbing, no edema.  NEUROLOGIC: No focal deficit, no gait disturbance, speech is fluent. SKIN: Intact,warm,dry.  Limited exam shows no rashes. PSYCH: Mood and behavior normal   Lab Results  Component Value Date   NITRICOXIDE 39 11/24/2022      Assessment & Plan:     ICD-10-CM   1. Moderate persistent asthma with acute exacerbation in adult  J45.41 CBC with Differential/Platelet   Mild exacerbation noted CBC with differential Medrol Dosepak No signs of infection Likely related to environmental allergens    2. Sarcoidosis  D86.9 CBC with Differential/Platelet    Angiotensin converting enzyme   Check ACE level    3. Shortness of breath  R06.02 Nitric oxide   Mild elevation on type II inflammation Suspect due to mild exacerbation of asthma Medrol as above      Orders Placed This Encounter  Procedures   CBC with Differential/Platelet  Standing Status:   Future    Number of Occurrences:   1    Standing Expiration Date:   05/25/2023   Angiotensin converting enzyme    Standing Status:   Future    Number of Occurrences:   1    Standing Expiration Date:   11/24/2023   Nitric oxide    Meds ordered this encounter  Medications   methylPREDNISolone (MEDROL DOSEPAK) 4 MG TBPK tablet    Sig: Take by mouth as directed in the package.  This is a  taper pack.    Dispense:  21 tablet    Refill:  0    C. Danice Goltz, MD Advanced Bronchoscopy P[facility]M [facility]    *This note was dictated using voice recognition software/Dragon.  Despite best efforts to proofread, errors can occur which can change the meaning. Any transcriptional errors that result from this process  are unintentional and may not be fully corrected at the time of dictation.

## 2022-11-24 NOTE — Patient Instructions (Addendum)
We have sent in prescriptions to your pharmacy.  This will be for drawl which is a prednisone like medication to help with your asthma flare.  Have a mild asthma flare.  It is shown by increased level of inflammation in your airways.  Stop by the lab to get blood drawn.  This is to check on allergy cells in your blood and also an ACE level to help monitor the sarcoidosis.  We will see you in follow-up in 3 months time call sooner should any new problems arise.

## 2022-11-25 LAB — ANGIOTENSIN CONVERTING ENZYME: Angiotensin-Converting Enzyme: 73 U/L (ref 14–82)

## 2022-11-27 NOTE — Progress Notes (Signed)
Well-documented on ALL NOTES.  She has moderate persistent asthma, sarcoidosis and issues with shortness of breath on a chronic basis.  All of these qualify for pulmonary function testing.  Her sarcoidosis needs to be monitored with pulmonary function testing.  Please see progress notes.  All of these disease states have been thoroughly documented.  Gailen Shelter, MD Advanced Bronchoscopy PCCM [facility]    *This note was dictated using voice recognition software/Dragon.  Despite best efforts to proofread, errors can occur which can change the meaning. Any transcriptional errors that result from this process are unintentional and may not be fully corrected at the time of dictation.

## 2022-12-05 ENCOUNTER — Encounter: Payer: Self-pay | Admitting: Pulmonary Disease

## 2022-12-30 ENCOUNTER — Inpatient Hospital Stay: Admission: RE | Admit: 2022-12-30 | Payer: Medicare Other | Source: Ambulatory Visit

## 2022-12-31 ENCOUNTER — Ambulatory Visit
Admission: RE | Admit: 2022-12-31 | Discharge: 2022-12-31 | Disposition: A | Discharge status: Home / Self Care | Payer: Medicare Other | Source: Ambulatory Visit | Attending: Nurse Practitioner | Admitting: Nurse Practitioner

## 2022-12-31 DIAGNOSIS — Z1231 Encounter for screening mammogram for malignant neoplasm of breast: Secondary | ICD-10-CM | POA: Diagnosis not present

## 2023-01-29 ENCOUNTER — Encounter: Payer: Self-pay | Admitting: Nurse Practitioner

## 2023-01-29 ENCOUNTER — Other Ambulatory Visit: Payer: Self-pay

## 2023-01-29 ENCOUNTER — Ambulatory Visit: Payer: Medicare Other | Admitting: Nurse Practitioner

## 2023-01-29 VITALS — BP 134/82 | HR 78 | Temp 98.0°F | Ht 62.5 in | Wt 180.0 lb

## 2023-01-29 DIAGNOSIS — E669 Obesity, unspecified: Secondary | ICD-10-CM | POA: Diagnosis not present

## 2023-01-29 DIAGNOSIS — Z13820 Encounter for screening for osteoporosis: Secondary | ICD-10-CM

## 2023-01-29 DIAGNOSIS — Z0000 Encounter for general adult medical examination without abnormal findings: Secondary | ICD-10-CM

## 2023-01-29 DIAGNOSIS — E7800 Pure hypercholesterolemia, unspecified: Secondary | ICD-10-CM

## 2023-01-29 DIAGNOSIS — J4520 Mild intermittent asthma, uncomplicated: Secondary | ICD-10-CM | POA: Diagnosis not present

## 2023-01-29 DIAGNOSIS — R7303 Prediabetes: Secondary | ICD-10-CM

## 2023-01-29 DIAGNOSIS — I10 Essential (primary) hypertension: Secondary | ICD-10-CM

## 2023-01-29 LAB — CBC
HCT: 43 % (ref 36.0–46.0)
Hemoglobin: 13.8 g/dL (ref 12.0–15.0)
MCHC: 32.1 g/dL (ref 30.0–36.0)
MCV: 78.8 fL (ref 78.0–100.0)
Platelets: 293 10*3/uL (ref 150.0–400.0)
RBC: 5.46 Mil/uL — ABNORMAL HIGH (ref 3.87–5.11)
RDW: 15.8 % — ABNORMAL HIGH (ref 11.5–15.5)
WBC: 8.3 10*3/uL (ref 4.0–10.5)

## 2023-01-29 LAB — COMPREHENSIVE METABOLIC PANEL
ALT: 14 U/L (ref 0–35)
AST: 16 U/L (ref 0–37)
Albumin: 4.1 g/dL (ref 3.5–5.2)
Alkaline Phosphatase: 124 U/L — ABNORMAL HIGH (ref 39–117)
BUN: 12 mg/dL (ref 6–23)
CO2: 28 meq/L (ref 19–32)
Calcium: 9.3 mg/dL (ref 8.4–10.5)
Chloride: 104 meq/L (ref 96–112)
Creatinine, Ser: 0.9 mg/dL (ref 0.40–1.20)
GFR: 66.15 mL/min (ref >60.00)
Glucose, Bld: 86 mg/dL (ref 70–99)
Potassium: 3.9 meq/L (ref 3.5–5.1)
Sodium: 140 meq/L (ref 135–145)
Total Bilirubin: 0.5 mg/dL (ref 0.2–1.2)
Total Protein: 7.6 g/dL (ref 6.0–8.3)

## 2023-01-29 LAB — TSH: TSH: 1.49 u[IU]/mL (ref 0.35–5.50)

## 2023-01-29 LAB — LIPID PANEL
Cholesterol: 227 mg/dL — ABNORMAL HIGH (ref 0–200)
HDL: 40.5 mg/dL (ref >39.00)
LDL Cholesterol: 164 mg/dL — ABNORMAL HIGH (ref 0–99)
NonHDL: 186.04
Total CHOL/HDL Ratio: 6
Triglycerides: 109 mg/dL (ref 0.0–149.0)
VLDL: 21.8 mg/dL (ref 0.0–40.0)

## 2023-01-29 LAB — HEMOGLOBIN A1C: Hgb A1c MFr Bld: 6.4 % (ref 4.6–6.5)

## 2023-01-29 MED ORDER — AMLODIPINE BESYLATE 5 MG PO TABS
5.0000 mg | ORAL_TABLET | Freq: Every day | ORAL | 3 refills | Status: DC
  Filled 2023-01-29: qty 90, 90d supply, fill #0
  Filled 2023-05-17: qty 90, 90d supply, fill #1
  Filled 2023-08-18: qty 90, 90d supply, fill #2
  Filled 2023-11-27: qty 90, 90d supply, fill #3

## 2023-01-29 NOTE — Progress Notes (Signed)
Established Patient Office Visit  Subjective   Patient ID: Meghan Leon, female    DOB: January 26, 1956  Age: 67 y.o. MRN: [id number]  Chief Complaint  Patient presents with   Annual Exam    Fasting     HPI  for complete physical and follow up of chronic conditions.   Asthma: Patient currently maintained on albuterol and Breztri inhaler she is followed by Dr. Sarina Ser through pulmonology.  Htn: does check it at home  infrequently.  Patient currently maintained on amlodipine 5 mg daily tolerates it well  Immunizations: -Tetanus: Completed in 2022 -Influenza: through employer 12/15/2022 -Shingles: Completed Shingrix series -Pneumonia: Completed Prevnar 2012 2623 -COVID: Original series and boosters   Diet: Fair diet. She is eating 2-3 melas and does snack. She will drink hot and sweet tea Exercise: Goes to the gym twice a week. For an hour  Eye exam: Completes annually. Glasses   Dental exam: Completes semi-annually    Colonoscopy: Completed in 04/13/2015, normal repeat in 10 years Lung Cancer Screening: N/A  Pap smear: Aged out  Mammogram: 01/01/2023, repeat 1 year  DEXA: order placed  Sleep: goes to bed around 10 and will get up 630-7. Feels rested and does snore        Review of Systems  Constitutional:  Negative for chills and fever.  Respiratory:  Negative for shortness of breath.   Cardiovascular:  Negative for chest pain and leg swelling.  Gastrointestinal:  Negative for abdominal pain, blood in stool, constipation, diarrhea, nausea and vomiting.       Bm daily   Genitourinary:  Negative for dysuria and hematuria.  Neurological:  Negative for tingling and headaches.  Psychiatric/Behavioral:  Negative for hallucinations and suicidal ideas.       Objective:     BP 134/82   Pulse 78   Temp 98 F (36.7 C) (Oral)   Ht 5' 2.5" (1.588 m)   Wt 180 lb (81.6 kg)   SpO2 98%   BMI 32.40 kg/m  BP Readings from Last 3 Encounters:  01/29/23 134/82   11/24/22 120/82  04/29/22 124/84   Wt Readings from Last 3 Encounters:  01/29/23 180 lb (81.6 kg)  11/24/22 176 lb 3.2 oz (79.9 kg)  04/29/22 181 lb 9.6 oz (82.4 kg)   SpO2 Readings from Last 3 Encounters:  01/29/23 98%  11/24/22 97%  04/29/22 97%    Physical Exam Vitals and nursing note reviewed.  Constitutional:      Appearance: Normal appearance.  HENT:     Right Ear: Tympanic membrane, ear canal and external ear normal.     Left Ear: Tympanic membrane, ear canal and external ear normal.     Mouth/Throat:     Mouth: Mucous membranes are moist.     Pharynx: Oropharynx is clear.  Eyes:     Extraocular Movements: Extraocular movements intact.     Pupils: Pupils are equal, round, and reactive to light.  Cardiovascular:     Rate and Rhythm: Normal rate and regular rhythm.     Pulses: Normal pulses.     Heart sounds: Normal heart sounds.  Pulmonary:     Effort: Pulmonary effort is normal.     Breath sounds: Normal breath sounds.  Abdominal:     General: Bowel sounds are normal. There is no distension.     Palpations: There is no mass.     Tenderness: There is no abdominal tenderness.     Hernia: No hernia is present.  Musculoskeletal:     Right lower leg: No edema.     Left lower leg: No edema.  Lymphadenopathy:     Cervical: No cervical adenopathy.  Skin:    General: Skin is warm.  Neurological:     General: No focal deficit present.     Mental Status: She is alert.     Deep Tendon Reflexes:     Reflex Scores:      Bicep reflexes are 2+ on the right side and 2+ on the left side.      Patellar reflexes are 2+ on the right side and 2+ on the left side.    Comments: Bilateral upper and lower extremity strength 5/5  Psychiatric:        Mood and Affect: Mood normal.        Behavior: Behavior normal.        Thought Content: Thought content normal.        Judgment: Judgment normal.      No results found for any visits on 01/29/23.    The 10-year ASCVD risk  score (Arnett DK, et al., 2019) is: 13.1%    Assessment & Plan:   Problem List Items Addressed This Visit       Cardiovascular and Mediastinum   Essential hypertension   Patient currently maintained on amlodipine 5 mg daily.  Checks blood pressure infrequently.  Blood pressure well-controlled continue medication as prescribed      Relevant Medications   amLODipine (NORVASC) 5 MG tablet   Other Relevant Orders   CBC   Comprehensive metabolic panel   TSH     Respiratory   Asthma   Patient currently maintained on albuterol inhaler as needed and Breztri maintenance inhaler.  She is followed by pulmonology.  Continue taking medication as prescribed follow-up with pulmonologist as recommended.        Other   Hyperlipidemia   Pending lipid panel today      Relevant Medications   amLODipine (NORVASC) 5 MG tablet   Other Relevant Orders   Lipid panel   Preventative health care - Primary   Discussed age-appropriate immunizations and screening exams.  Did review patient's personal, surgical, social, family histories.  Patient is up-to-date on all age-appropriate vaccinations she would like.  Patient is up-to-date on CRC screening, breast cancer screening, no longer participates in cervical cancer screening.  Order for DEXA scan placed today for screening for osteoporosis.  Patient was given information at discharge about preventative healthcare maintenance with anticipatory guidance.      Relevant Orders   CBC   Comprehensive metabolic panel   TSH   Obesity (BMI 30-39.9)   Pending TSH, A1c, lipid panel.  Patient is leading a healthy lifestyle continue      Relevant Orders   Hemoglobin A1c   TSH   Lipid panel   Prediabetes   Pending A1c today      Relevant Orders   Hemoglobin A1c   Other Visit Diagnoses       Screening for osteoporosis       Relevant Orders   DG Bone Density       Return in about 1 year (around 01/29/2024) for CPE and Labs.    Audria Nine,  NP

## 2023-01-29 NOTE — Assessment & Plan Note (Signed)
Pending lipid panel today 

## 2023-01-29 NOTE — Assessment & Plan Note (Signed)
Patient currently maintained on albuterol inhaler as needed and Breztri maintenance inhaler.  She is followed by pulmonology.  Continue taking medication as prescribed follow-up with pulmonologist as recommended.

## 2023-01-29 NOTE — Patient Instructions (Addendum)
Nice to see you today I will be in touch with the labs once I have reviewed them Follow up with me in 1 years    You have an order for:  []   2D Mammogram  []   3D Mammogram  [x]   Bone Density     Please call for appointment:   [x]   [facility]  [address] [place] [address]  [phone]  []   [facility]   [address]  [place], [place] [address]  [phone]  []   [facility]      [address] [place], [place]        [phone]         []   [facility]  [address] [place], [place]  [phone]  []  [facility]   [address]   [place], [place] [address]  [phone]  []  [facility]  [address] [place], [place] [address] [phone]    Make sure to wear two piece clothing  No lotions powders or deodorants the day of the appointment Make sure to bring picture ID and insurance card.  Bring list of medications you are currently taking including any supplements.   Schedule your screening mammogram through MyChart!   Select [facility] imaging sites can now be scheduled through MyChart.  Log into your MyChart account.  Go to 'Visit' (or 'Appointments' if  on mobile App) --> Schedule an  Appointment  Under 'Select a Reason for Visit' choose the Mammogram  Screening option.  Complete the pre-visit questions  and select the time and place that  best fits your schedule

## 2023-01-29 NOTE — Assessment & Plan Note (Signed)
Discussed age-appropriate immunizations and screening exams.  Did review patient's personal, surgical, social, family histories.  Patient is up-to-date on all age-appropriate vaccinations she would like.  Patient is up-to-date on CRC screening, breast cancer screening, no longer participates in cervical cancer screening.  Order for DEXA scan placed today for screening for osteoporosis.  Patient was given information at discharge about preventative healthcare maintenance with anticipatory guidance.

## 2023-01-29 NOTE — Assessment & Plan Note (Signed)
Pending TSH, A1c, lipid panel.  Patient is leading a healthy lifestyle continue

## 2023-01-29 NOTE — Assessment & Plan Note (Signed)
Patient currently maintained on amlodipine 5 mg daily.  Checks blood pressure infrequently.  Blood pressure well-controlled continue medication as prescribed

## 2023-01-29 NOTE — Assessment & Plan Note (Signed)
Pending A1c today 

## 2023-01-30 ENCOUNTER — Encounter: Payer: Self-pay | Admitting: Nurse Practitioner

## 2023-02-02 NOTE — Telephone Encounter (Signed)
Can we make sure she gets scheduled

## 2023-02-12 ENCOUNTER — Ambulatory Visit
Admission: RE | Admit: 2023-02-12 | Discharge: 2023-02-12 | Disposition: A | Discharge status: Home / Self Care | Payer: Medicare Other | Source: Ambulatory Visit | Attending: Nurse Practitioner | Admitting: Nurse Practitioner

## 2023-02-12 DIAGNOSIS — Z90710 Acquired absence of both cervix and uterus: Secondary | ICD-10-CM | POA: Insufficient documentation

## 2023-02-12 DIAGNOSIS — M8589 Other specified disorders of bone density and structure, multiple sites: Secondary | ICD-10-CM | POA: Diagnosis not present

## 2023-02-12 DIAGNOSIS — M85851 Other specified disorders of bone density and structure, right thigh: Secondary | ICD-10-CM | POA: Diagnosis not present

## 2023-02-12 DIAGNOSIS — Z13820 Encounter for screening for osteoporosis: Secondary | ICD-10-CM | POA: Diagnosis not present

## 2023-03-06 IMAGING — MG MM DIGITAL SCREENING BILAT W/ TOMO AND CAD
8 series · 8 of 24 positions shown · non-contrast
Comparison: Previous exam(s).

CLINICAL DATA: Screening.

EXAM:
DIGITAL SCREENING BILATERAL MAMMOGRAM WITH TOMOSYNTHESIS AND CAD
TECHNIQUE: Bilateral screening digital craniocaudal and mediolateral oblique
mammograms were obtained. Bilateral screening digital breast
tomosynthesis was performed. The images were evaluated with
computer-aided detection.

[R CC synth-2D]
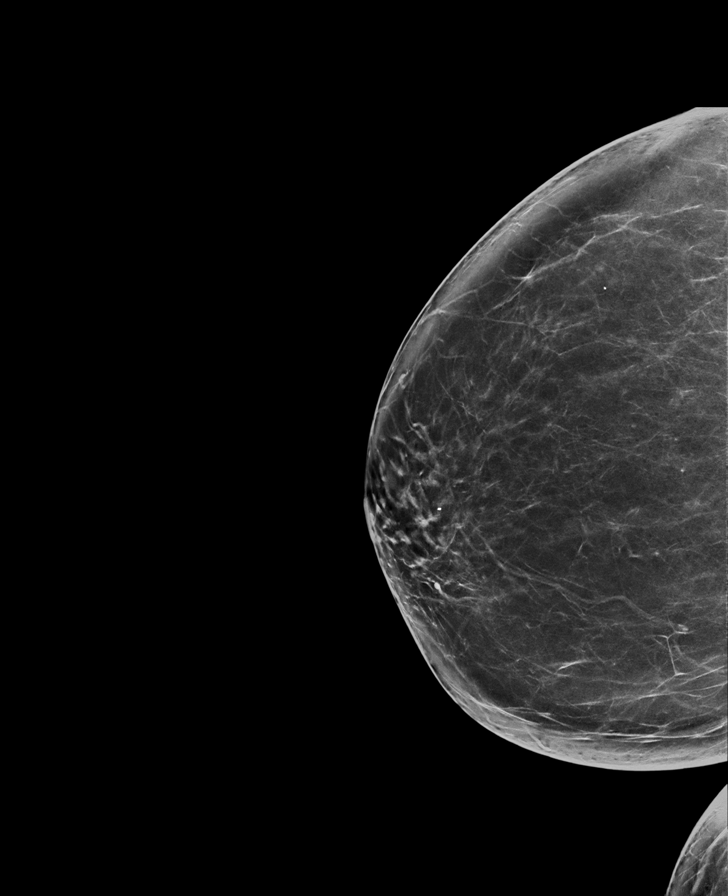

[R MLO synth-2D]
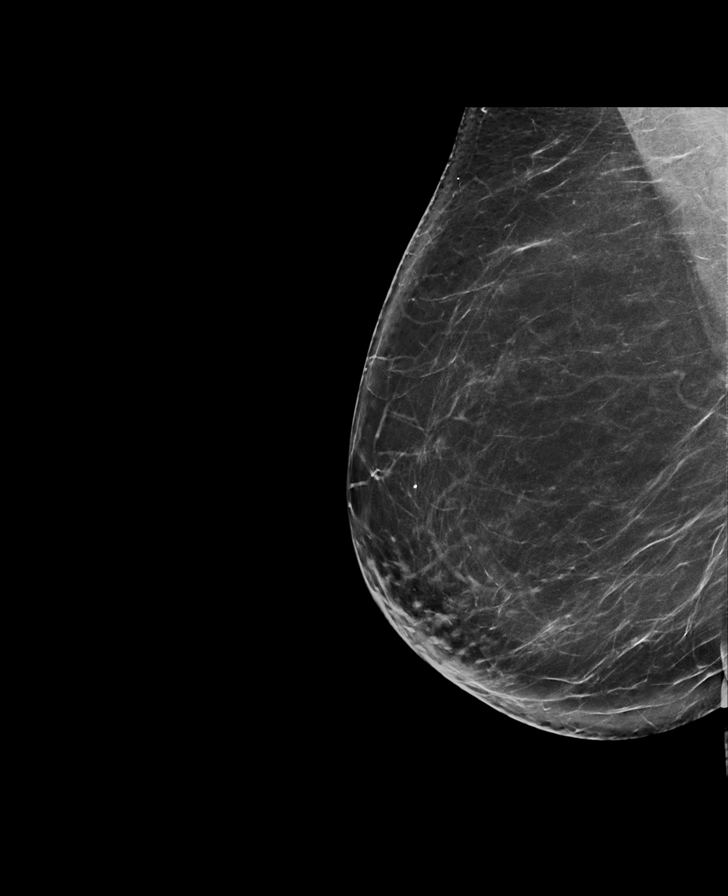

[L MLO synth-2D]
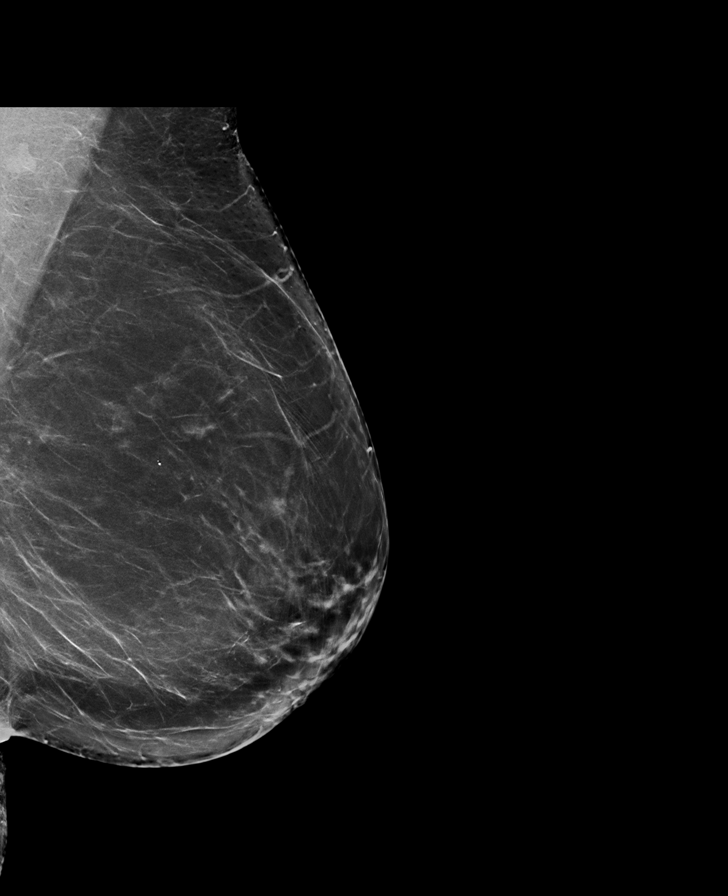

[L CC synth-2D]
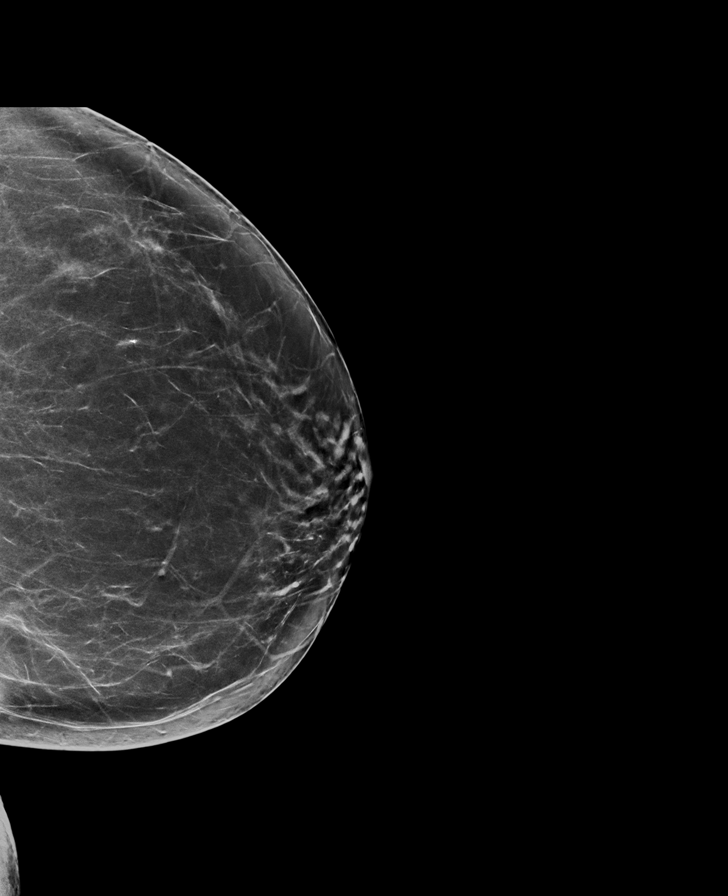

[L MLO tomo · tomo slice 47/94.0]
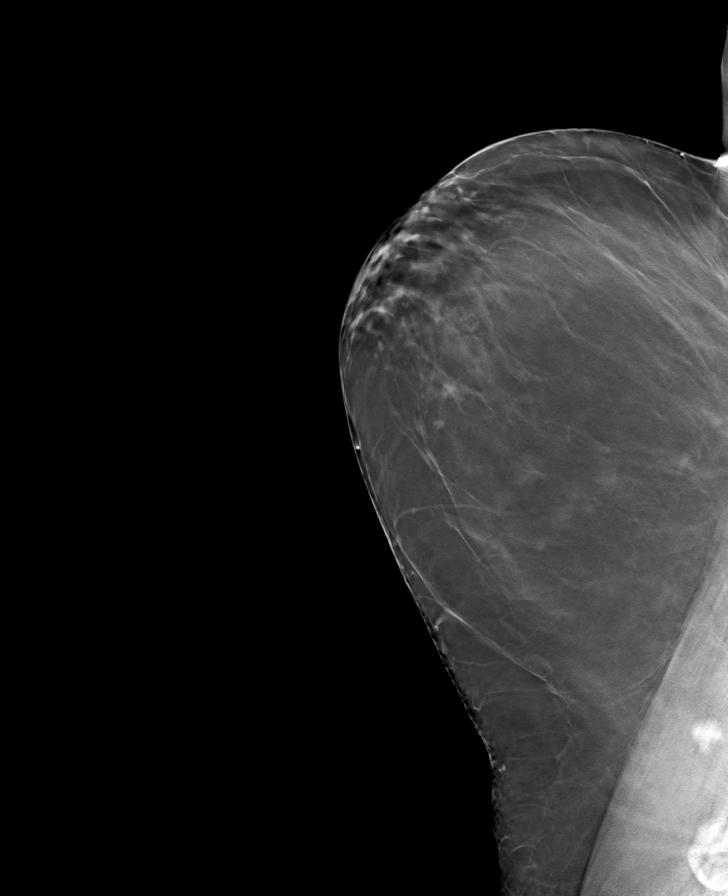

[R CC tomo · tomo slice 42/83.0]
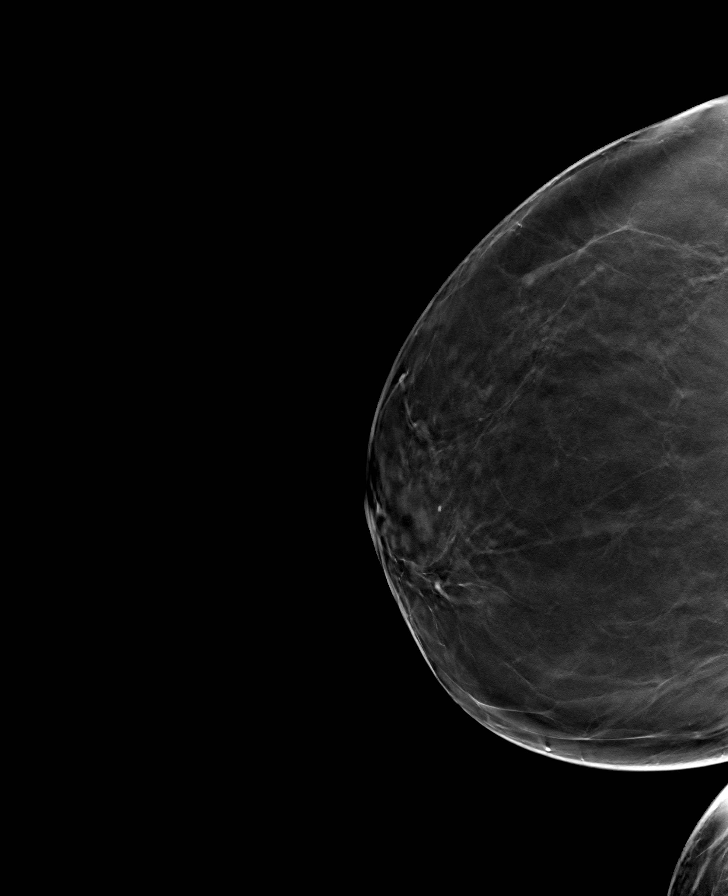

[L CC tomo · tomo slice 39/78.0]
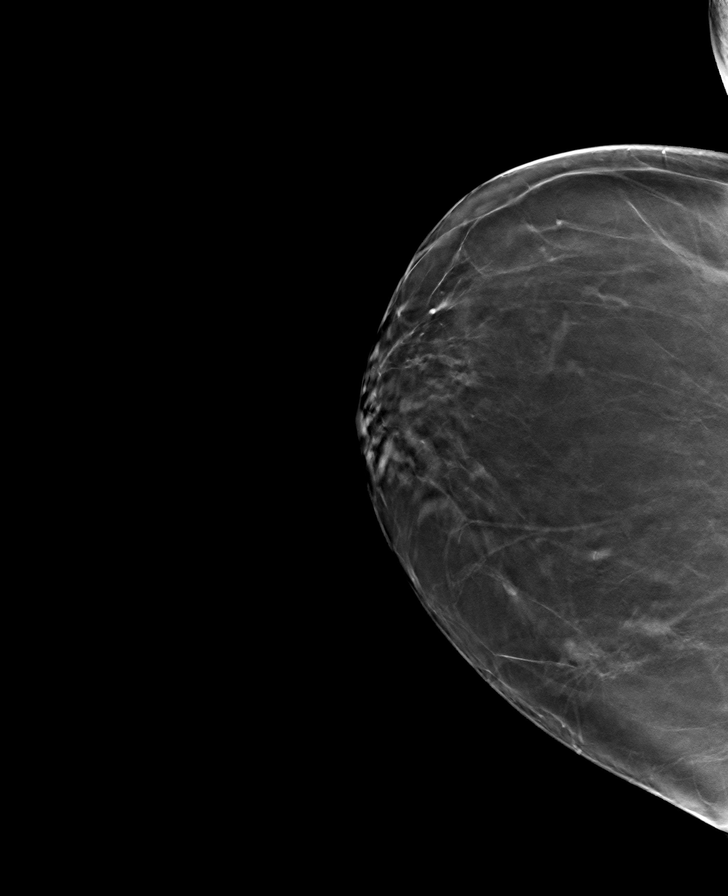

[R MLO tomo · tomo slice 45/88.0]
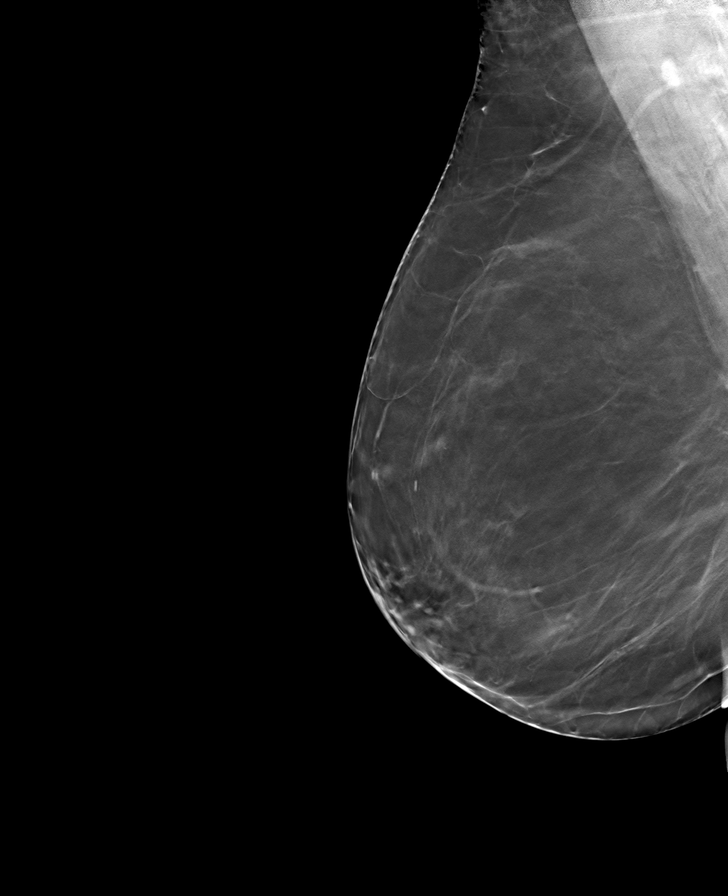

[8 of 24 positions shown; findings below may reference images not displayed]

ACR Breast Density Category b: There are scattered areas of
fibroglandular density.
FINDINGS: There are no findings suspicious for malignancy.
IMPRESSION: No mammographic evidence of malignancy. A result letter of this
screening mammogram will be mailed directly to the patient.

RECOMMENDATION:
Screening mammogram in one year. (Code:51-O-LD2)

BI-RADS CATEGORY  1: Negative.

## 2023-03-19 ENCOUNTER — Ambulatory Visit: Payer: Medicare Other | Admitting: Pulmonary Disease

## 2023-03-19 ENCOUNTER — Other Ambulatory Visit: Payer: Self-pay

## 2023-03-19 ENCOUNTER — Encounter: Payer: Self-pay | Admitting: Pulmonary Disease

## 2023-03-19 VITALS — BP 122/80 | HR 87 | Temp 96.9°F | Ht 62.5 in | Wt 182.6 lb

## 2023-03-19 DIAGNOSIS — J4540 Moderate persistent asthma, uncomplicated: Secondary | ICD-10-CM | POA: Diagnosis not present

## 2023-03-19 DIAGNOSIS — D869 Sarcoidosis, unspecified: Secondary | ICD-10-CM

## 2023-03-19 LAB — NITRIC OXIDE: Nitric Oxide: 55

## 2023-03-19 MED ORDER — TRELEGY ELLIPTA 200-62.5-25 MCG/ACT IN AEPB
1.0000 | INHALATION_SPRAY | Freq: Every day | RESPIRATORY_TRACT | 0 refills | Status: DC
  Filled 2023-03-19: qty 28, fill #0

## 2023-03-19 MED ORDER — TRELEGY ELLIPTA 200-62.5-25 MCG/ACT IN AEPB: 1.0000 | INHALATION_SPRAY | Freq: Every day | RESPIRATORY_TRACT | 0 refills | Status: DC

## 2023-03-19 NOTE — Progress Notes (Signed)
Subjective:    Patient ID: Meghan Leon, female    DOB: 1955-05-13, 68 y.o.   MRN: [id number]  Patient Care Team: Eden Emms, NP as PCP - General (Nurse Practitioner)  Chief Complaint  Patient presents with   Follow-up    No SOB or wheezing. Cough with clear sputum.    BACKGROUND/INTERVAL:Meghan Leon is a 68 year old lifelong never smoker with a history of moderate persistent asthma and sarcoidosis.  She follows here for the same.  This is a scheduled visit.  Last visit here was on 25 December 2022, at that time she was treated for a mild exacerbation with a Medrol Dosepak.  This issue resolved with the interventions prescribed at that time.  She had follow-up CT scan of the chest on 19 August 2022 that did not show any significant change from her baseline sarcoidosis changes.  Presents today for follow-up.  HPI Discussed the use of AI scribe software for clinical note transcription with the patient, who gave verbal consent to proceed.  History of Present Illness   The patient, with sarcoidosis and moderate persistent asthma, presents for follow-up of her respiratory conditions.  She experiences a persistent cough every morning upon waking, which she attributes to postnasal drip. She uses a saline wash to clear her nasal passages and has stopped having houseplants to reduce mold exposure. She does not use nasal spray frequently and does not take over-the-counter medications like Zyrtec or Claritin.  She is currently using Breztri twice a day for her asthma. She previously reduced her dose to one puff due to heart palpitations but acknowledges that the recommended dose is two puffs twice a day. She has difficulty breathing in cold weather, particularly when engaging in outdoor activities, which she attributes to her asthma. She previously used Advair, which worked well until it was no longer covered by her insurance. Her nitric oxide level is 55 ppb, indicating increased type II inflammation in  the airway. She is currently obtaining her medications from the [facility] pharmacy.  Her sarcoidosis is being monitored, and no specific issues related to sarcoidosis are noted.  Most recent ACE level was on 24 November 2022 and was within normal limits.   DATA 19 July 2019 PFTs: FEV1 1.19 L or 60% predicted, FVC 1.78 L or 70% predicted.  Postbronchodilator there is a 21% net change on FEV1 to 1.45 L or 73% predicted and equally on 19% change in FVC demonstrating decreased air trapping.  Lung volumes are mildly reduced consistent with the patient's sarcoidosis.  Diffusion capacity mildly reduced when alveolar volume is taken into account. 19 July 2019 CT scan of the chest:  shows calcified mediastinal lymphadenopathy consistent with sarcoidosis and fibrotic changes on the right upper lobe clustered perilymphatic and perifissural nodules consistent with sarcoidosis.  The findings are stable. 19 January 2020 CT scan of the chest: Changes of sarcoidosis with one area slightly enlarged however still consistent with sarcoidosis. 08/10/2020 CT chest without contrast: Persistent changes consistent with sarcoidosis. 08/12/2021 CT chest without contrast mediastinal/hilar pulmonary parenchymal findings of sarcoid with mild progression in the lungs from CT of 08/10/2020 enlarged pulmonary trunk consistent pulmonary arterial hypertension. 09/11/2021 echocardiogram: LVEF 55 to 60% grade 1 DD.  Trivial mitral valve regurgitation, thickening of the aortic valve without aortic stenosis. 08/19/2022 CT chest without contrast: Redemonstrated findings consistent with pulmonary sarcoidosis involving confluent interstitial disease in the right upper lobe and superior right lower lobe.  Unchanged perilymphatic nodularity.  Enlarged calcified mediastinal and bilateral hilar lymph  nodes unchanged when compared to prior exam consistent with sarcoidosis. 11/20/2022 PFTs: FEV1 1.33 L or 57% predicted, FVC 1.95 L or 64% predicted, FEV1/FVC  68%, lung volumes normal to mildly reduced, diffusion capacity mildly reduced but corrects for alveolar volume.  Overall no significant change when compared to study performed 19 July 2019. 11/24/2022: ACE level: 73, normal.  Review of Systems A 10 point review of systems was performed and it is as noted above otherwise negative.   Patient Active Problem List   Diagnosis Date Noted   Prediabetes 01/29/2023   Preventative health care 02/11/2022   Obesity (BMI 30-39.9) 02/11/2022   Acute pain of right knee 06/03/2021   Sarcoidosis 08/04/2019   Acute pain of left shoulder 08/04/2019   Essential hypertension 04/06/2019   Asthma 04/06/2019   Hyperlipidemia 04/06/2019    Social History   Tobacco Use   Smoking status: Never   Smokeless tobacco: Never   Tobacco comments:    never  Substance Use Topics   Alcohol use: No    No Known Allergies  Current Meds  Medication Sig   albuterol (VENTOLIN HFA) 108 (90 Base) MCG/ACT inhaler Inhale 2 puffs into the lungs every 6 (six) hours as needed for wheezing or shortness of breath.   amLODipine (NORVASC) 5 MG tablet Take 1 tablet (5 mg total) by mouth daily.   [DISCONTINUED] Budeson-Glycopyrrol-Formoterol (BREZTRI AEROSPHERE) 160-9-4.8 MCG/ACT AERO Inhale 1 puff into the lungs in the morning and at bedtime   [DISCONTINUED] Fluticasone-Umeclidin-Vilant (TRELEGY ELLIPTA) 200-62.5-25 MCG/ACT AEPB Inhale 1 puff into the lungs daily.    Immunization History  Administered Date(s) Administered   Fluad Quad(high Dose 65+) 12/12/2021   Influenza-Unspecified 12/08/2018, 12/15/2019, 12/16/2022   PFIZER(Purple Top)SARS-COV-2 Vaccination 03/11/2019, 04/01/2019, 02/07/2020   PNEUMOCOCCAL CONJUGATE-20 02/11/2022   Pfizer Covid-19 Vaccine Bivalent Booster 32yrs & up 03/22/2021   Tdap 06/03/2005, 11/13/2020   Zoster Recombinant(Shingrix) 11/09/2019, 01/18/2020        Objective:     BP 122/80 (BP Location: Right Arm, Cuff Size: Normal)   Pulse 87    Temp (!) 96.9 F (36.1 C)   Ht 5' 2.5" (1.588 m)   Wt 182 lb 9.6 oz (82.8 kg)   SpO2 96%   BMI 32.87 kg/m   SpO2: 96 % O2 Device: None (Room air)  GENERAL: Awake, alert, fully ambulatory.  No respiratory distress.  No conversational dyspnea. HEAD: Normocephalic, atraumatic.  EYES: Pupils equal, round, reactive to light.  No scleral icterus.  MOUTH: Dentition intact, oral mucosa moist.  No thrush. NECK: Supple. No thyromegaly. Trachea midline. No JVD.  No adenopathy. PULMONARY: Good air entry bilaterally.  Scattered end expiratory wheezes noted. CARDIOVASCULAR: S1 and S2. Regular rate and rhythm.  No rubs murmurs or gallops heard. GASTROINTESTINAL: No abdominal distention noted. MUSCULOSKELETAL: No joint deformity, no clubbing, no edema.  NEUROLOGIC: No focal deficit, no gait disturbance, speech is fluent. SKIN: Intact,warm,dry.  Limited exam shows no rashes. PSYCH: Mood and behavior normal.    Lab Results  Component Value Date   NITRICOXIDE 55 03/19/2023    Assessment & Plan:     ICD-10-CM   1. Moderate persistent asthma without complication  J45.40 Nitric oxide    2. Sarcoidosis  D86.9       Orders Placed This Encounter  Procedures   Nitric oxide    Meds ordered this encounter  Medications   Fluticasone-Umeclidin-Vilant (TRELEGY ELLIPTA) 200-62.5-25 MCG/ACT AEPB    Sig: Inhale 1 puff into the lungs daily.    Dispense:  28 each    Refill:  0    Lot Number?:   [id number]    Expiration Date?:   05/18/2024    Quantity:   2   Discussion:    Moderate Persistent Asthma Symptoms include daily morning cough, wheezing, and dyspnea in cold weather. Elevated airway inflammation indicated by nitric oxide level of 55 ppb. Current treatment with Markus Daft is ineffective and causes tachycardia at effective dose. Discussed switching to Trelegy Ellipta, which may be better tolerated. Provided samples for trial. - Discontinue Breztri - Initiate Trelegy Ellipta 200, one puff daily -  Provide one month's worth of Trelegy Ellipta samples - Instruct on proper use of Trelegy Ellipta - Follow up in 4-6 weeks to assess asthma control  Postnasal Drip Contributing to morning cough. Not using nasal spray or OTC medications like Zyrtec or Claritin. Discussed potential benefits of regular use. - Recommend regular use of nasal spray - Consider OTC antihistamines like Zyrtec or Claritin if symptoms persist  Sarcoidosis Chronic condition with no symptoms or changes - Continue to monitor  General Health Maintenance Steps taken to reduce allergens: saline wash, dusting, removing houseplants.  Follow-up - Schedule follow-up appointment in 4-6 weeks.    Advised if symptoms do not improve or worsen, to please contact office for sooner follow up or seek emergency care.    I spent 40 minutes of dedicated to the care of this patient on the date of this encounter to include pre-visit review of records, face-to-face time with the patient discussing conditions above, post visit ordering of testing, clinical documentation with the electronic health record, making appropriate referrals as documented, and communicating necessary findings to members of the patients care team.     C. Danice Goltz, MD Advanced Bronchoscopy PCCM [facility]    *This note was generated using voice recognition software/Dragon and/or AI transcription program.  Despite best efforts to proofread, errors can occur which can change the meaning. Any transcriptional errors that result from this process are unintentional and may not be fully corrected at the time of dictation.

## 2023-03-19 NOTE — Patient Instructions (Signed)
VISIT SUMMARY:  You came in today for a follow-up on your respiratory conditions, specifically your asthma and postnasal drip. We discussed your current symptoms and made some adjustments to your treatment plan to help improve your condition.  YOUR PLAN:  -MODERATE PERSISTENT ASTHMA: Moderate persistent asthma is a type of asthma that causes daily symptoms and may affect your daily activities. Your current medication, Markus Daft, is not effectively controlling your symptoms and is causing heart palpitations. We are switching you to Trelegy Ellipta, which you will take as one puff daily. I have provided you with a month's worth of samples and instructions on how to use it. We will follow up in 4-6 weeks to see how you are doing.  -POSTNASAL DRIP: Postnasal drip occurs when excess mucus from the nose drips down the back of the throat, often causing a cough. You are currently not using nasal sprays or over-the-counter medications like Zyrtec or Claritin. I recommend you start using a nasal spray regularly and consider using an over-the-counter antihistamine if your symptoms continue.  -SARCOIDOSIS: Sarcoidosis is a condition where inflammatory cells grow in different parts of your body, usually the lungs and lymph nodes. There were no specific symptoms or changes related to your sarcoidosis discussed during this visit.  -GENERAL HEALTH MAINTENANCE: You are taking good steps to reduce allergens in your environment, such as using a saline wash, dusting, and removing houseplants. Continue these practices to help manage your symptoms.  INSTRUCTIONS:  Please schedule a follow-up appointment in 4-6 weeks to assess your asthma control and overall progress.

## 2023-04-10 ENCOUNTER — Ambulatory Visit: Payer: Medicare Other

## 2023-04-10 VITALS — Ht 62.5 in | Wt 179.0 lb

## 2023-04-10 DIAGNOSIS — H9190 Unspecified hearing loss, unspecified ear: Secondary | ICD-10-CM | POA: Diagnosis not present

## 2023-04-10 DIAGNOSIS — Z0000 Encounter for general adult medical examination without abnormal findings: Secondary | ICD-10-CM | POA: Diagnosis not present

## 2023-04-10 NOTE — Patient Instructions (Addendum)
 Meghan Leon , Thank you for taking time to come for your Medicare Wellness Visit. I appreciate your ongoing commitment to your health goals. Please review the following plan we discussed and let me know if I can assist you in the future.   Referrals/Orders/Follow-Ups/Clinician Recommendations:   Patient complains of difficulty with hearing. ENT referral placed. Patient is in agreement with treatment plan. Aware that the office will call with an appointment.    This is a list of the screening recommended for you and due dates:  Health Maintenance  Topic Date Due   COVID-19 Vaccine (5 - 2024-25 season) 10/19/2022   Medicare Annual Wellness Visit  04/09/2024   Mammogram  12/30/2024   Colon Cancer Screening  04/12/2025   DTaP/Tdap/Td vaccine (3 - Td or Tdap) 11/14/2030   Pneumonia Vaccine  Completed   Flu Shot  Completed   DEXA scan (bone density measurement)  Completed   Hepatitis C Screening  Completed   Zoster (Shingles) Vaccine  Completed   HPV Vaccine  Aged Out    Advanced directives: (In Chart) A copy of your advanced directives are scanned into your chart should your provider ever need it.  Next Medicare Annual Wellness Visit scheduled for next year: Yes 04/12/2024 @ 11:30am televisit

## 2023-04-10 NOTE — Progress Notes (Addendum)
 Subjective:   Meghan Leon is a 68 y.o. who presents for a Medicare Wellness preventive visit.  Visit Complete: Virtual I connected with  Meghan Leon on 04/10/23 by a video and audio enabled telemedicine application and verified that I am speaking with the correct person using two identifiers.  Patient Location: Home  Provider Location: Home Office  I discussed the limitations of evaluation and management by telemedicine. The patient expressed understanding and agreed to proceed.  Vital Signs: Because this visit was a virtual/telehealth visit, some criteria may be missing or patient reported. Any vitals not documented were not able to be obtained and vitals that have been documented are patient reported.   AWV Questionnaire: No: Patient Medicare AWV questionnaire was not completed prior to this visit.  Cardiac Risk Factors include: advanced age (>3men, >73 women);dyslipidemia;hypertension;obesity (BMI >30kg/m2)     Objective:    Today's Vitals   04/10/23 1130  Weight: 179 lb (81.2 kg)  Height: 5' 2.5" (1.588 m)   Body mass index is 32.22 kg/m.     04/10/2023   11:44 AM 05/24/2019    2:13 AM 03/09/2019    2:49 PM 03/09/2019    2:03 PM 04/13/2015   10:30 AM  Advanced Directives  Does Patient Have a Medical Advance Directive? Yes No No No Yes  Type of Estate agent of [place];Living will      Copy of Healthcare Power of Attorney in Chart? Yes - validated most recent copy scanned in chart (See row information)      Would patient like information on creating a medical advance directive?   No - Patient declined      Current Medications (verified) Outpatient Encounter Medications as of 04/10/2023  Medication Sig   albuterol (VENTOLIN HFA) 108 (90 Base) MCG/ACT inhaler Inhale 2 puffs into the lungs every 6 (six) hours as needed for wheezing or shortness of breath.   amLODipine (NORVASC) 5 MG tablet Take 1 tablet (5 mg total) by mouth daily.    Fluticasone-Umeclidin-Vilant (TRELEGY ELLIPTA) 200-62.5-25 MCG/ACT AEPB Inhale 1 puff into the lungs daily.   No facility-administered encounter medications on file as of 04/10/2023.    Allergies (verified) Patient has no known allergies.   History: Past Medical History:  Diagnosis Date   Allergy 15 years ago   chemical exposure   Asthma    Hypertension January 2021   ED visit   Medical history non-contributory    Past Surgical History:  Procedure Laterality Date   ABDOMINAL HYSTERECTOMY  Age 75   [place]   CESAREAN SECTION     COLONOSCOPY WITH PROPOFOL N/A 04/13/2015   Procedure: COLONOSCOPY WITH PROPOFOL;  Surgeon: Christena Deem, MD;  Location: [facility] ENDOSCOPY;  Service: Endoscopy;  Laterality: N/A;   Family History  Problem Relation Age of Onset   Alzheimer's disease Mother    Depression Mother    Hypertension Mother    Alcohol abuse Father    Diabetes Father    Liver cancer Father    Cancer Sister        Lung cancer   Diabetes Sister    Alcohol abuse Sister    Arthritis Sister    COPD Sister    Depression Sister    Drug abuse Sister    Hypertension Sister    Hyperlipidemia Sister    Alcohol abuse Sister    Hyperlipidemia Sister    Hypertension Sister    Diabetes Sister    Hypertension Sister  Diabetes Brother    Alcohol abuse Brother    Breast cancer Daughter 67   Hypertension Daughter    Social History   Socioeconomic History   Marital status: Married    Spouse name: Ray   Number of children: 2   Years of education: college   Highest education level: Not on file  Occupational History   Not on file  Tobacco Use   Smoking status: Never   Smokeless tobacco: Never   Tobacco comments:    never  Vaping Use   Vaping status: Never Used  Substance and Sexual Activity   Alcohol use: No   Drug use: No   Sexual activity: Yes    Birth control/protection: Surgical  Other Topics Concern   Not on file  Social History Narrative   04/06/19   From:  the area   Living: with husband Ray, and adult daughter    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      Family: Engineer, manufacturing - daughter, grandson Apolinar Junes 03-May-2018) (her daughter passed away)      Enjoys: walking, traveling, [place], cruises       Exercise: walks when the weather is nice   Diet: eating more since working from Programmer, multimedia belts: Yes    Guns: No   Safe in relationships: Yes    Social Drivers of Corporate investment banker Strain: Low Risk  (04/10/2023)   Overall Financial Resource Strain (CARDIA)    Difficulty of Paying Living Expenses: Not very hard  Food Insecurity: No Food Insecurity (04/10/2023)   Hunger Vital Sign    Worried About Running Out of Food in the Last Year: Never true    Ran Out of Food in the Last Year: Never true  Transportation Needs: No Transportation Needs (04/10/2023)   PRAPARE - Administrator, Civil Service (Medical): No    Lack of Transportation (Non-Medical): No  Physical Activity: Insufficiently Active (04/10/2023)   Exercise Vital Sign    Days of Exercise per Week: 2 days    Minutes of Exercise per Session: 40 min  Stress: No Stress Concern Present (04/10/2023)   [employer] of Occupational Health - Occupational Stress Questionnaire    Feeling of Stress : Not at all  Social Connections: Moderately Integrated (04/10/2023)   Social Connection and Isolation Panel [NHANES]    Frequency of Communication with Friends and Family: More than three times a week    Frequency of Social Gatherings with Friends and Family: More than three times a week    Attends Religious Services: More than 4 times per year    Active Member of [employer] or Organizations: No    Attends Engineer, structural: Never    Marital Status: Married    Tobacco Counseling Counseling given: Not Answered Tobacco comments: never    Clinical Intake:  Pre-visit preparation completed: Yes  Pain : No/denies pain     BMI -  recorded: 32.22 Nutritional Status: BMI > 30  Obese Nutritional Risks: None Diabetes: No  How often do you need to have someone help you when you read instructions, pamphlets, or other written materials from your doctor or pharmacy?: 1 - Never  Interpreter Needed?: No  Comments: lives with husband Information entered by :: B.Merril Nagy,LPN   Activities of Daily Living     04/10/2023   11:45 AM  In your present state of health, do you have any difficulty performing the  following activities:  Hearing? 1  Vision? 0  Difficulty concentrating or making decisions? 1  Walking or climbing stairs? 0  Dressing or bathing? 0  Doing errands, shopping? 0  Preparing Food and eating ? N  Using the Toilet? N  In the past six months, have you accidently leaked urine? N  Do you have problems with loss of bowel control? N  Managing your Medications? N  Managing your Finances? N  Housekeeping or managing your Housekeeping? N    Patient Care Team: Eden Emms, NP as PCP - General (Nurse Practitioner)  Indicate any recent Medical Services you may have received from other than [facility] providers in the past year (date may be approximate).     Assessment:   This is a routine wellness examination for Meghan Leon.  Hearing/Vision screen Hearing Screening - Comments:: Pt says she does not hear as well as she use ZO:XWRUE TV up too loud per others Audiology referral Vision Screening - Comments:: Pt says her vision is good w/distance glasses (driving only) Dr Dion Body   Goals Addressed               This Visit's Progress     Patient Stated (pt-stated)        I want to reduce my A1C and my cholesterol       Depression Screen     04/10/2023   11:38 AM 01/29/2023    8:49 AM 02/11/2022    9:38 AM 11/13/2020    9:10 AM 11/09/2019    9:03 AM 04/06/2019   10:40 AM  PHQ 2/9 Scores  PHQ - 2 Score 0 0 0 0 0 0  PHQ- 9 Score  0 0       Fall Risk     04/10/2023   11:35 AM 01/29/2023    8:49  AM  Fall Risk   Falls in the past year? 0 0  Number falls in past yr: 0 0  Injury with Fall? 0 0  Risk for fall due to : No Fall Risks No Fall Risks  Follow up Education provided;Falls prevention discussed Falls evaluation completed    MEDICARE RISK AT HOME:  Medicare Risk at Home Any stairs in or around the home?: No If so, are there any without handrails?: No Home free of loose throw rugs in walkways, pet beds, electrical cords, etc?: Yes Adequate lighting in your home to reduce risk of falls?: Yes Life alert?: No Use of a cane, walker or w/c?: No Grab bars in the bathroom?: Yes Shower chair or bench in shower?: Yes Elevated toilet seat or a handicapped toilet?: No  TIMED UP AND GO:  Was the test performed?  No  Cognitive Function: 6CIT completed        04/10/2023   11:49 AM  6CIT Screen  What Year? 0 points  What month? 0 points  What time? 0 points  Count back from 20 0 points  Months in reverse 0 points  Repeat phrase 0 points  Total Score 0 points    Immunizations Immunization History  Administered Date(s) Administered   Fluad Quad(high Dose 65+) 12/12/2021   Influenza-Unspecified 12/08/2018, 12/15/2019, 12/16/2022   PFIZER(Purple Top)SARS-COV-2 Vaccination 03/11/2019, 04/01/2019, 02/07/2020   PNEUMOCOCCAL CONJUGATE-20 02/11/2022   Pfizer Covid-19 Vaccine Bivalent Booster 77yrs & up 03/22/2021   Tdap 06/03/2005, 11/13/2020   Zoster Recombinant(Shingrix) 11/09/2019, 01/18/2020    Screening Tests Health Maintenance  Topic Date Due   COVID-19 Vaccine (5 - 2024-25 season) 10/19/2022  Medicare Annual Wellness (AWV)  04/09/2024   MAMMOGRAM  12/30/2024   Colonoscopy  04/12/2025   DTaP/Tdap/Td (3 - Td or Tdap) 11/14/2030   Pneumonia Vaccine 18+ Years old  Completed   INFLUENZA VACCINE  Completed   DEXA SCAN  Completed   Hepatitis C Screening  Completed   Zoster Vaccines- Shingrix  Completed   HPV VACCINES  Aged Out    Health Maintenance  Health  Maintenance Due  Topic Date Due   COVID-19 Vaccine (5 - 2024-25 season) 10/19/2022   Health Maintenance Items Addressed:  Audiology referral for hearing test   Additional Screening:  Vision Screening: Recommended annual ophthalmology exams for early detection of glaucoma and other disorders of the eye.  Dental Screening: Recommended annual dental exams for proper oral hygiene  Community Resource Referral / Chronic Care Management: CRR required this visit?  No   CCM required this visit?  No    Plan:     I have personally reviewed and noted the following in the patient's chart:   Medical and social history Use of alcohol, tobacco or illicit drugs  Current medications and supplements including opioid prescriptions. Patient is not currently taking opioid prescriptions. Functional ability and status Nutritional status Physical activity Advanced directives List of other physicians Hospitalizations, surgeries, and ER visits in previous 12 months Vitals Screenings to include cognitive, depression, and falls Referrals and appointments  In addition, I have reviewed and discussed with patient certain preventive protocols, quality metrics, and best practice recommendations. A written personalized care plan for preventive services as well as general preventive health recommendations were provided to patient.    Sue Lush, LPN   1/61/0960   After Visit Summary: (MyChart) Due to this being a telephonic visit, the after visit summary with patients personalized plan was offered to patient via MyChart   Notes: Pt with difficulty hearing:audiology referral placed.

## 2023-04-10 NOTE — Addendum Note (Signed)
 Addended by: Sue Lush on: 04/10/2023 12:13 PM   Modules accepted: Orders

## 2023-04-15 ENCOUNTER — Other Ambulatory Visit: Payer: Self-pay | Admitting: Nurse Practitioner

## 2023-04-15 DIAGNOSIS — R7303 Prediabetes: Secondary | ICD-10-CM

## 2023-04-15 DIAGNOSIS — E785 Hyperlipidemia, unspecified: Secondary | ICD-10-CM

## 2023-04-30 ENCOUNTER — Ambulatory Visit: Payer: Medicare Other | Admitting: Pulmonary Disease

## 2023-04-30 ENCOUNTER — Other Ambulatory Visit: Payer: Self-pay

## 2023-04-30 ENCOUNTER — Encounter: Payer: Self-pay | Admitting: Pulmonary Disease

## 2023-04-30 VITALS — BP 118/82 | HR 87 | Temp 97.1°F | Ht 62.5 in | Wt 185.0 lb

## 2023-04-30 DIAGNOSIS — J4540 Moderate persistent asthma, uncomplicated: Secondary | ICD-10-CM | POA: Diagnosis not present

## 2023-04-30 DIAGNOSIS — D869 Sarcoidosis, unspecified: Secondary | ICD-10-CM | POA: Diagnosis not present

## 2023-04-30 LAB — NITRIC OXIDE: Nitric Oxide: 34

## 2023-04-30 MED ORDER — TRELEGY ELLIPTA 200-62.5-25 MCG/ACT IN AEPB: 1.0000 | INHALATION_SPRAY | Freq: Every day | RESPIRATORY_TRACT | Status: DC

## 2023-04-30 MED ORDER — TRELEGY ELLIPTA 200-62.5-25 MCG/ACT IN AEPB
1.0000 | INHALATION_SPRAY | Freq: Every day | RESPIRATORY_TRACT | 11 refills | Status: DC
  Filled 2023-04-30: qty 60, 30d supply, fill #0

## 2023-04-30 NOTE — Patient Instructions (Signed)
 VISIT SUMMARY:  During today's visit, we discussed the management of your asthma and sarcoidosis. You reported significant improvement in your breathing since starting Trelegy, and your airway inflammation has decreased. Your sarcoidosis remains stable with no new symptoms. We also talked about some challenges with dietary habits.  YOUR PLAN:  -ASTHMA: Asthma is a condition where your airways become inflamed and narrow, making it hard to breathe. Your asthma is well-controlled with Trelegy, which has significantly reduced your airway inflammation. Continue using Trelegy as prescribed, and we will reassess your asthma control in six months.  -SARCOIDOSIS: Sarcoidosis is a condition where clusters of inflammatory cells form in different parts of your body, often the lungs. Your sarcoidosis is stable with no new symptoms, so no additional imaging is needed at this time. We will reassess the need for further tests at your next follow-up unless your symptoms change.  INSTRUCTIONS:  Please continue using Trelegy as prescribed for your asthma. We will schedule a follow-up appointment in six months to reassess your asthma control and the stability of your sarcoidosis. If you experience any new symptoms or changes in your condition, please contact our office immediately.

## 2023-04-30 NOTE — Progress Notes (Signed)
 Subjective:    Patient ID: Meghan Leon, female    DOB: [birth year], 67 y.o.   MRN: [id number]  Patient Care Team: Eden Emms, NP as PCP - General (Nurse Practitioner) Blair Promise, OD (Optometry)  Chief Complaint  Patient presents with   Follow-up    No SOB, wheezing or cough.     BACKGROUND/INTERVAL: Meghan Leon is a 67 year old lifelong never smoker with a history of moderate persistent asthma and sarcoidosis.  She follows here for the same.  This is a scheduled visit.  Last visit here was on 19 March 2023.  Presents today for scheduled follow-up.   HPI Discussed the use of AI scribe software for clinical note transcription with the patient, who gave verbal consent to proceed.  History of Present Illness   The patient, with asthma and sarcoidosis, presents for management of her conditions.  She has experienced significant improvement in her breathing since the last visit, attributing this to the use of Trelegy, which she finds more effective. Her nitric oxide levels have decreased from the fifties to the thirties, indicating reduced airway inflammation. She is currently using Trelegy for asthma management, with a prescription sent to the pharmacy and a sample provided to avoid an immediate trip. No new respiratory symptoms or other health concerns are reported.  Her sarcoidosis appears stable with no current need for additional imaging or intervention. The condition will be reassessed at the next visit unless symptoms change.  No new issues or concerns were noted during the review of symptoms. Challenges with dietary habits, specifically difficulty in avoiding sweets, were discussed.  Overall she tries to maintain a healthy diet.     DATA 19 July 2019 PFTs: FEV1 1.19 L or 60% predicted, FVC 1.78 L or 70% predicted.  Postbronchodilator there is a 21% net change on FEV1 to 1.45 L or 73% predicted and equally on 19% change in FVC demonstrating decreased air trapping.  Lung  volumes are mildly reduced consistent with the patient's sarcoidosis.  Diffusion capacity mildly reduced when alveolar volume is taken into account. 19 July 2019 CT scan of the chest:  shows calcified mediastinal lymphadenopathy consistent with sarcoidosis and fibrotic changes on the right upper lobe clustered perilymphatic and perifissural nodules consistent with sarcoidosis.  The findings are stable. 19 January 2020 CT scan of the chest: Changes of sarcoidosis with one area slightly enlarged however still consistent with sarcoidosis. 08/10/2020 CT chest without contrast: Persistent changes consistent with sarcoidosis. 08/12/2021 CT chest without contrast mediastinal/hilar pulmonary parenchymal findings of sarcoid with mild progression in the lungs from CT of 08/10/2020 enlarged pulmonary trunk consistent pulmonary arterial hypertension. 09/11/2021 echocardiogram: LVEF 55 to 60% grade 1 DD.  Trivial mitral valve regurgitation, thickening of the aortic valve without aortic stenosis. 08/19/2022 CT chest without contrast: Redemonstrated findings consistent with pulmonary sarcoidosis involving confluent interstitial disease in the right upper lobe and superior right lower lobe.  Unchanged perilymphatic nodularity.  Enlarged calcified mediastinal and bilateral hilar lymph nodes unchanged when compared to prior exam consistent with sarcoidosis. 11/20/2022 PFTs: FEV1 1.33 L or 57% predicted, FVC 1.95 L or 64% predicted, FEV1/FVC 68%, lung volumes normal to mildly reduced, diffusion capacity mildly reduced but corrects for alveolar volume.  Overall no significant change when compared to study performed 19 July 2019. 11/24/2022: ACE level: 73, normal.  Review of Systems A 10 point review of systems was performed and it is as noted above otherwise negative.   Patient Active Problem List   Diagnosis Date Noted  Prediabetes 01/29/2023   Preventative health care 02/11/2022   Obesity (BMI 30-39.9) 02/11/2022    Sarcoidosis 08/04/2019   Essential hypertension 04/06/2019   Asthma 04/06/2019   Hyperlipidemia 04/06/2019    Social History   Tobacco Use   Smoking status: Never   Smokeless tobacco: Never   Tobacco comments:    never  Substance Use Topics   Alcohol use: No    No Known Allergies  Current Meds  Medication Sig   albuterol (VENTOLIN HFA) 108 (90 Base) MCG/ACT inhaler Inhale 2 puffs into the lungs every 6 (six) hours as needed for wheezing or shortness of breath.   amLODipine (NORVASC) 5 MG tablet Take 1 tablet (5 mg total) by mouth daily.   Fluticasone-Umeclidin-Vilant (TRELEGY ELLIPTA) 200-62.5-25 MCG/ACT AEPB Inhale 1 puff into the lungs daily.   [DISCONTINUED] Fluticasone-Umeclidin-Vilant (TRELEGY ELLIPTA) 200-62.5-25 MCG/ACT AEPB Inhale 1 puff into the lungs daily.    Immunization History  Administered Date(s) Administered   Fluad Quad(high Dose 65+) 12/12/2021   Influenza-Unspecified 12/08/2018, 12/15/2019, 12/16/2022   PFIZER(Purple Top)SARS-COV-2 Vaccination 03/11/2019, 04/01/2019, 02/07/2020   PNEUMOCOCCAL CONJUGATE-20 02/11/2022   Pfizer Covid-19 Vaccine Bivalent Booster 54yrs & up 03/22/2021   Tdap 06/03/2005, 11/13/2020   Zoster Recombinant(Shingrix) 11/09/2019, 01/18/2020        Objective:     BP 118/82 (BP Location: Right Arm, Cuff Size: Normal)   Pulse 87   Temp (!) 97.1 F (36.2 C)   Ht 5' 2.5" (1.588 m)   Wt 185 lb (83.9 kg)   SpO2 97%   BMI 33.30 kg/m   SpO2: 97 % O2 Device: None (Room air)  GENERAL: Awake, alert, fully ambulatory.  No respiratory distress.  No conversational dyspnea. HEAD: Normocephalic, atraumatic.  EYES: Pupils equal, round, reactive to light.  No scleral icterus.  MOUTH: Dentition intact, oral mucosa moist.  No thrush. NECK: Supple. No thyromegaly. Trachea midline. No JVD.  No adenopathy. PULMONARY: Good air entry bilaterally.  Scattered end expiratory wheezes noted. CARDIOVASCULAR: S1 and S2. Regular rate and rhythm.   No rubs murmurs or gallops heard. GASTROINTESTINAL: No abdominal distention noted. MUSCULOSKELETAL: No joint deformity, no clubbing, no edema.  NEUROLOGIC: No focal deficit, no gait disturbance, speech is fluent. SKIN: Intact,warm,dry.  Limited exam shows no rashes. PSYCH: Mood and behavior normal.   Lab Results  Component Value Date   NITRICOXIDE 34 04/30/2023  *Trend:39>>55>>34    Assessment & Plan:     ICD-10-CM   1. Moderate persistent asthma without complication  J45.40 Nitric oxide    2. Sarcoidosis  D86.9       Orders Placed This Encounter  Procedures   Nitric oxide    Meds ordered this encounter  Medications   Fluticasone-Umeclidin-Vilant (TRELEGY ELLIPTA) 200-62.5-25 MCG/ACT AEPB    Sig: Inhale 1 puff into the lungs daily.    Dispense:  60 each    Refill:  11    Lot Number?:   [id number]    Expiration Date?:   05/18/2024    Quantity:   2   Fluticasone-Umeclidin-Vilant (TRELEGY ELLIPTA) 200-62.5-25 MCG/ACT AEPB    Sig: Inhale 1 puff into the lungs daily.    Dispense:  14 each    Lot Number?:   [id number]    Expiration Date?:   06/17/2024    Quantity:   1   Discussion:    Moderate persistent asthma Asthma is well-controlled with current treatment. Airway inflammation has decreased significantly, with nitric oxide levels reduced from the fifties to the thirties, within normal range.  She reports improved breathing, and physical examination reveals no wheezing or abnormal breath sounds. Trelegy is effective in managing symptoms. - Continue Trelegy therapy and provide a sample. - Send prescription for Trelegy to pharmacy. - Schedule follow-up in six months to reassess asthma control.  Sarcoidosis Sarcoidosis is stable with no new symptoms or changes necessitating further imaging or intervention. No chest CT is ordered at this time due to condition stability. - Reassess need for chest CT at next follow-up unless symptoms change.      Advised if symptoms do not improve or  worsen, to please contact office for sooner follow up or seek emergency care.    I spent 30  minutes of dedicated to the care of this patient on the date of this encounter to include pre-visit review of records, face-to-face time with the patient discussing conditions above, post visit ordering of testing, clinical documentation with the electronic health record, making appropriate referrals as documented, and communicating necessary findings to members of the patients care team.     C. Danice Goltz, MD Advanced Bronchoscopy PCCM [facility]    *This note was generated using voice recognition software/Dragon and/or AI transcription program.  Despite best efforts to proofread, errors can occur which can change the meaning. Any transcriptional errors that result from this process are unintentional and may not be fully corrected at the time of dictation.

## 2023-05-04 ENCOUNTER — Other Ambulatory Visit: Payer: Medicare Other

## 2023-05-08 ENCOUNTER — Other Ambulatory Visit

## 2023-05-08 DIAGNOSIS — E785 Hyperlipidemia, unspecified: Secondary | ICD-10-CM | POA: Diagnosis not present

## 2023-05-08 DIAGNOSIS — R7303 Prediabetes: Secondary | ICD-10-CM

## 2023-05-08 LAB — LIPID PANEL
Cholesterol: 223 mg/dL — ABNORMAL HIGH (ref 0–200)
HDL: 46.5 mg/dL (ref >39.00)
LDL Cholesterol: 160 mg/dL — ABNORMAL HIGH (ref 0–99)
NonHDL: 176.73
Total CHOL/HDL Ratio: 5
Triglycerides: 86 mg/dL (ref 0.0–149.0)
VLDL: 17.2 mg/dL (ref 0.0–40.0)

## 2023-05-08 LAB — HEMOGLOBIN A1C: Hgb A1c MFr Bld: 6.4 % (ref 4.6–6.5)

## 2023-05-11 ENCOUNTER — Encounter: Payer: Self-pay | Admitting: Nurse Practitioner

## 2023-05-14 ENCOUNTER — Ambulatory Visit: Payer: Medicare Other | Attending: Nurse Practitioner | Admitting: Audiology

## 2023-05-14 DIAGNOSIS — H903 Sensorineural hearing loss, bilateral: Secondary | ICD-10-CM | POA: Diagnosis not present

## 2023-05-14 NOTE — Procedures (Signed)
  Outpatient Audiology and [facility] [address] [place], [place]  [address] [phone]  AUDIOLOGICAL  EVALUATION  NAME: Meghan Leon     DOB:   03-02-1955      MRN: [id number]                                                                                     DATE: 05/14/2023     REFERENT: Eden Emms, NP STATUS: Outpatient DIAGNOSIS: Sensorineural hearing loss  History: Meghan Leon was seen for an audiological evaluation.  Meghan Leon reports her primary care physician recommended a hearing evaluation.  [name] denies concerns regarding her hearing sensitivity.  Meghan Leon reports her husband has concerns regarding her hearing sensitivity.  She denies otalgia, aural fullness, tinnitus, and dizziness.  Evaluation:  Otoscopy showed a clear view of the tympanic membranes, bilaterally Tympanometry results were consistent with normal middle ear pressure and normal tympanic membrane mobility (Type A) bilaterally Audiometric testing was completed using Conventional Audiometry techniques with insert earphones and TDH headphones. Test results are consistent with a mild to moderate sensorineural hearing loss in both ears.Marland Kitchen Speech Recognition Thresholds were obtained at 40 dB HL in the right ear and at 30 dB HL in the left ear. Word Recognition Testing was completed at 70 dB HL and [name] scored 100% bilaterally  Results:  The test results were reviewed with Meghan Leon.  Today's test results are consistent with a mild to moderate sensorineural hearing loss in both ears.  Meghan Leon will have hearing and communication difficulty in many listening environments.  She will benefit from the use of good communication strategies and hearing aids.  The benefits of amplification and hearing aids were briefly discussed.  Meghan Leon reported she is not interested in pursuing amplification at this time but will continue to monitor her hearing sensitivity.  Recommendations: 1.   Communication needs assessment with  an audiologist to discuss amplification if motivated to pursue hearing aids. 2.    Return in 2 years for an audiological evaluation to monitor hearing sensitivity.  30 minutes spent testing and counseling on results.  The audiogram can be found under the media file.  If you have any questions please feel free to contact me at [phone].  Marton Redwood Audiologist, Au.D., CCC-A 05/14/2023  1:57 PM  Cc: Eden Emms, NP

## 2023-05-21 ENCOUNTER — Other Ambulatory Visit: Payer: Self-pay

## 2023-05-21 MED ORDER — TRELEGY ELLIPTA 200-62.5-25 MCG/ACT IN AEPB: 1.0000 | INHALATION_SPRAY | Freq: Every day | RESPIRATORY_TRACT | 3 refills | Status: DC

## 2023-05-21 NOTE — Progress Notes (Signed)
 Received a fax from [facility] asking for a prescription for Trelegy for this patient.   I called the patient and she confirmed she wants the Trelegy sent to [facility].  I have sent in the script.  Nothing further needed.

## 2023-09-21 ENCOUNTER — Other Ambulatory Visit: Payer: Self-pay

## 2023-09-21 ENCOUNTER — Encounter: Payer: Self-pay | Admitting: Pulmonary Disease

## 2023-09-21 ENCOUNTER — Ambulatory Visit
Admission: RE | Admit: 2023-09-21 | Discharge: 2023-09-21 | Disposition: A | Discharge status: Home / Self Care | Source: Ambulatory Visit | Attending: Pulmonary Disease | Admitting: Pulmonary Disease

## 2023-09-21 ENCOUNTER — Ambulatory Visit: Admitting: Pulmonary Disease

## 2023-09-21 VITALS — BP 118/80 | HR 90 | Temp 97.1°F | Ht 62.5 in | Wt 186.2 lb

## 2023-09-21 DIAGNOSIS — D869 Sarcoidosis, unspecified: Secondary | ICD-10-CM

## 2023-09-21 DIAGNOSIS — R0602 Shortness of breath: Secondary | ICD-10-CM

## 2023-09-21 DIAGNOSIS — R590 Localized enlarged lymph nodes: Secondary | ICD-10-CM | POA: Diagnosis not present

## 2023-09-21 DIAGNOSIS — J4541 Moderate persistent asthma with (acute) exacerbation: Secondary | ICD-10-CM | POA: Diagnosis not present

## 2023-09-21 DIAGNOSIS — J984 Other disorders of lung: Secondary | ICD-10-CM | POA: Diagnosis not present

## 2023-09-21 DIAGNOSIS — Z862 Personal history of diseases of the blood and blood-forming organs and certain disorders involving the immune mechanism: Secondary | ICD-10-CM | POA: Diagnosis not present

## 2023-09-21 MED ORDER — IPRATROPIUM-ALBUTEROL 0.5-2.5 (3) MG/3ML IN SOLN
3.0000 mL | Freq: Once | RESPIRATORY_TRACT | Status: AC
Start: 2023-09-21 — End: 2023-09-21
  Administered 2023-09-21: 3 mL via RESPIRATORY_TRACT

## 2023-09-21 MED ORDER — METHYLPREDNISOLONE 4 MG PO TBPK
ORAL_TABLET | ORAL | 0 refills | Status: DC
  Filled 2023-09-21: qty 21, 6d supply, fill #0

## 2023-09-21 MED ORDER — ALBUTEROL SULFATE (2.5 MG/3ML) 0.083% IN NEBU
2.5000 mg | INHALATION_SOLUTION | Freq: Four times a day (QID) | RESPIRATORY_TRACT | 3 refills | Status: AC | PRN
  Filled 2023-09-21: qty 75, 7d supply, fill #0

## 2023-09-21 MED ORDER — DOXYCYCLINE HYCLATE 100 MG PO TABS
100.0000 mg | ORAL_TABLET | Freq: Two times a day (BID) | ORAL | 0 refills | Status: AC
  Filled 2023-09-21: qty 14, 7d supply, fill #0

## 2023-09-21 NOTE — Patient Instructions (Signed)
 VISIT SUMMARY:  Today, you were seen for increased shortness of breath and upper back pain that started yesterday. You also have significant coughing and sinus mucus, but no fever or chills. You are currently using Trelegy in the morning but experience shortness of breath by the afternoon.  YOUR PLAN:  -MODERATE PERSISTENT ASTHMA WITH ACUTE EXACERBATION: You are experiencing a worsening of your asthma symptoms, likely due to bronchitis or atypical pneumonia. Asthma is a condition where your airways narrow and swell, making it hard to breathe. We have prescribed a Medrol  DosePak (prednisone  taper) and antibiotics to help manage your symptoms. A chest x-ray has been ordered to rule out other conditions, and we will ensure you have nebulizer medication at home.  -SUSPECTED ATYPICAL PNEUMONIA VERSUS ACUTE BRONCHITIS: Your symptoms of cough, congestion, and shortness of breath without fever suggest either atypical pneumonia or acute bronchitis. Pneumonia is an infection that inflames the air sacs in one or both lungs, while bronchitis is the inflammation of the lining of your bronchial tubes. We have prescribed antibiotics to cover potential atypical pneumonia and ordered a chest x-ray to confirm the diagnosis.  - MODERATE PERSISTENT ASTHMA: You have moderate persistent asthma, a chronic inflammatory lung disease that obstructs airflow from the lungs. You are currently using Trelegy, which works well in the morning but not as effectively by the afternoon. We will evaluate the need to switch you to a twice-daily medication after resolving your current exacerbation.  We will discuss in further detail on your follow-up appointment on 9 September.  INSTRUCTIONS:  Please follow up with the chest x-ray as soon as possible. Continue taking your prescribed medications, including the Medrol  DosePak and doxycycline .  Make sure that you watch your sun exposure while on doxycycline  as it can cause a sunburn like  reaction on the skin.  Ensure you have your nebulizer medication at home. We will reassess your need for a different COPD medication after your current symptoms improve.

## 2023-09-21 NOTE — Progress Notes (Signed)
 Subjective:    Patient ID: Meghan Leon, female    DOB: December 27, 1955, 68 y.o.   MRN: [id number]  Patient Care Team: Wendee Lynwood HERO, NP as PCP - General (Nurse Practitioner) Portia Fireman, OD [facility])  Chief Complaint  Patient presents with   Acute Visit    Pain in back. Cough, dry. Nasal drainage. No wheezing. Increased SOB. Symptoms started yesterday.     BACKGROUND/INTERVAL: Meghan Leon presents for an acute visit for increased shortness of breath and pleuritic back pain noted since yesterday.  No fevers or chills.  She has sarcoidosis and moderate persistent asthma.  HPI Discussed the use of AI scribe software for clinical note transcription with the patient, who gave verbal consent to proceed.  History of Present Illness   Meghan Leon is a 68 year old female who presents with increased shortness of breath and upper back pain.  She has experienced increased shortness of breath and pleuritic upper back pain since yesterday. The pain is accompanied by a sensation of heaviness in the chest. No fever or chills are present. She has significant coughing without sputum production, although there is notable sinus mucus.  She uses a nebulizer at home but has run out of medication. Currently, she is on Trelegy, taken in the morning, but experiences recurrent shortness of breath by the afternoon.  She does not endorse any weight loss or anorexia.  He has noticed some increased wheezing.   DATA 19 July 2019 PFTs: FEV1 1.19 L or 60% predicted, FVC 1.78 L or 70% predicted.  Postbronchodilator there is a 21% net change on FEV1 to 1.45 L or 73% predicted and equally on 19% change in FVC demonstrating decreased air trapping.  Lung volumes are mildly reduced consistent with the patient's sarcoidosis.  Diffusion capacity mildly reduced when alveolar volume is taken into account. 19 July 2019 CT scan of the chest:  shows calcified mediastinal lymphadenopathy consistent with sarcoidosis  and fibrotic changes on the right upper lobe clustered perilymphatic and perifissural nodules consistent with sarcoidosis.  The findings are stable. 19 January 2020 CT scan of the chest: Changes of sarcoidosis with one area slightly enlarged however still consistent with sarcoidosis. 08/10/2020 CT chest without contrast: Persistent changes consistent with sarcoidosis. 08/12/2021 CT chest without contrast mediastinal/hilar pulmonary parenchymal findings of sarcoid with mild progression in the lungs from CT of 08/10/2020 enlarged pulmonary trunk consistent pulmonary arterial hypertension. 09/11/2021 echocardiogram: LVEF 55 to 60% grade 1 DD.  Trivial mitral valve regurgitation, thickening of the aortic valve without aortic stenosis. 08/19/2022 CT chest without contrast: Redemonstrated findings consistent with pulmonary sarcoidosis involving confluent interstitial disease in the right upper lobe and superior right lower lobe.  Unchanged perilymphatic nodularity.  Enlarged calcified mediastinal and bilateral hilar lymph nodes unchanged when compared to prior exam consistent with sarcoidosis. 11/20/2022 PFTs: FEV1 1.33 L or 57% predicted, FVC 1.95 L or 64% predicted, FEV1/FVC 68%, lung volumes normal to mildly reduced, diffusion capacity mildly reduced but corrects for alveolar volume.  Overall no significant change when compared to study performed 19 July 2019. 11/24/2022: ACE level: 73, normal.   Review of Systems A 10 point review of systems was performed and it is as noted above otherwise negative.   Patient Active Problem Leon   Diagnosis Date Noted   Prediabetes 01/29/2023   Preventative health care 02/11/2022   Obesity (BMI 30-39.9) 02/11/2022   Sarcoidosis 08/04/2019   Essential hypertension 04/06/2019   Asthma 04/06/2019   Hyperlipidemia 04/06/2019  Social History   Tobacco Use   Smoking status: Never   Smokeless tobacco: Never   Tobacco comments:    never  Substance Use Topics    Alcohol use: No    No Known Allergies  Current Meds  Medication Sig   albuterol  (PROVENTIL ) (2.5 MG/3ML) 0.083% nebulizer solution Take 3 mLs (2.5 mg total) by nebulization every 6 (six) hours as needed for wheezing or shortness of breath.   albuterol  (VENTOLIN  HFA) 108 (90 Base) MCG/ACT inhaler Inhale 2 puffs into the lungs every 6 (six) hours as needed for wheezing or shortness of breath.   amLODipine  (NORVASC ) 5 MG tablet Take 1 tablet (5 mg total) by mouth daily.   doxycycline  (VIBRA -TABS) 100 MG tablet Take 1 tablet (100 mg total) by mouth 2 (two) times daily for 7 days.   Fluticasone -Umeclidin-Vilant (TRELEGY ELLIPTA ) 200-62.5-25 MCG/ACT AEPB Inhale 1 puff into the lungs daily.   methylPREDNISolone  (MEDROL  DOSEPAK) 4 MG TBPK tablet Take as directed on the package, this is a taper pack.    Immunization History  Administered Date(s) Administered   Fluad Quad(high Dose 65+) 12/12/2021   Influenza-Unspecified 12/08/2018, 12/15/2019, 12/16/2022   PFIZER(Purple Top)SARS-COV-2 Vaccination 03/11/2019, 04/01/2019, 02/07/2020   PNEUMOCOCCAL CONJUGATE-20 02/11/2022   Pfizer Covid-19 Vaccine Bivalent Booster 23yrs & up 03/22/2021   Tdap 06/03/2005, 11/13/2020   Zoster Recombinant(Shingrix) 11/09/2019, 01/18/2020        Objective:     BP 118/80 (BP Location: Right Arm, Cuff Size: Normal)   Pulse 90   Temp (!) 97.1 F (36.2 C)   Ht 5' 2.5 (1.588 m)   Wt 186 lb 3.2 oz (84.5 kg)   SpO2 98%   BMI 33.51 kg/m   SpO2: 98 % O2 Device: None (Room air)  GENERAL: Awake, alert, fully ambulatory.  No respiratory distress.  No conversational dyspnea. HEAD: Normocephalic, atraumatic.  EYES: Pupils equal, round, reactive to light.  No scleral icterus.  MOUTH: Dentition intact, oral mucosa moist.  No thrush. NECK: Supple. No thyromegaly. Trachea midline. No JVD.  No adenopathy. PULMONARY: Good air entry bilaterally.  Scattered rhonchi and end expiratory wheezes noted. CARDIOVASCULAR: S1  and S2. Regular rate and rhythm.  No rubs murmurs or gallops heard. GASTROINTESTINAL: No abdominal distention noted. MUSCULOSKELETAL: No joint deformity, no clubbing, no edema.  NEUROLOGIC: No focal deficit, no gait disturbance, speech is fluent. SKIN: Intact,warm,dry.  Limited exam shows no rashes. PSYCH: Mood and behavior normal.  Patient received DuoNeb nebulizer x 1: Wheezing and rhonchi resolved, patient's dyspnea improved.      Assessment & Plan:     ICD-10-CM   1. Shortness of breath  R06.02 DG Chest 2 View    ipratropium-albuterol  (DUONEB) 0.5-2.5 (3) MG/3ML nebulizer solution 3 mL    2. Moderate persistent asthma with acute exacerbation  J45.41 ipratropium-albuterol  (DUONEB) 0.5-2.5 (3) MG/3ML nebulizer solution 3 mL    3. Sarcoidosis  D86.9 ipratropium-albuterol  (DUONEB) 0.5-2.5 (3) MG/3ML nebulizer solution 3 mL      Orders Placed This Encounter  Procedures   DG Chest 2 View    Standing Status:   Future    Number of Occurrences:   1    Expected Date:   09/21/2023    Expiration Date:   09/20/2024    Reason for Exam (SYMPTOM  OR DIAGNOSIS REQUIRED):   Shortness of breath    Preferred imaging location?:   [facility]    Meds ordered this encounter  Medications   ipratropium-albuterol  (DUONEB) 0.5-2.5 (3) MG/3ML nebulizer solution 3  mL   methylPREDNISolone  (MEDROL  DOSEPAK) 4 MG TBPK tablet    Sig: Take as directed on the package, this is a taper pack.    Dispense:  21 tablet    Refill:  0   doxycycline  (VIBRA -TABS) 100 MG tablet    Sig: Take 1 tablet (100 mg total) by mouth 2 (two) times daily for 7 days.    Dispense:  14 tablet    Refill:  0   albuterol  (PROVENTIL ) (2.5 MG/3ML) 0.083% nebulizer solution    Sig: Take 3 mLs (2.5 mg total) by nebulization every 6 (six) hours as needed for wheezing or shortness of breath.    Dispense:  75 mL    Refill:  3   Discussion:    Moderate persistent asthma with acute exacerbation Increased shortness of breath and  upper back pain since yesterday, with significant congestion and sinus mucus, but no fever or chills. Exacerbation likely due to bronchitis versus atypical pneumonia. Nebulizer treatment improved shortness of breath. - Prescribe Medrol  DosePak (methylprednisolone  taper). - Prescribe antibiotics (doxycycline  100 mg twice daily x 7 days). - Order chest x-ray to rule out other conditions. - Ensure she has nebulizer medication at home (albuterol  for as needed use).  Suspected atypical pneumonia versus acute bronchitis Differential diagnosis includes atypical pneumonia and acute bronchitis, given symptoms of cough, congestion, and shortness of breath without fever. Walking pneumonia considered. - Prescribe antibiotics to cover potential atypical pneumonia. - Order chest x-ray to confirm diagnosis.  Sarcoidosis/sarcoid bronchitis/asthmatic bronchitis Currently using Trelegy, effective in the morning but experiences shortness of breath by afternoon. Potential change to a twice-daily medication considered to manage symptoms more effectively throughout the day. - Evaluate the need to switch from Trelegy to a twice-daily medication after resolving the current exacerbation.     Advised if symptoms do not improve or worsen, to please contact office for sooner follow up or seek emergency care.    I spent 40 minutes of dedicated to the care of this patient on the date of this encounter to include pre-visit review of records, face-to-face time with the patient discussing conditions above, post visit ordering of testing, clinical documentation with the electronic health record, making appropriate referrals as documented, and communicating necessary findings to members of the patients care team.     C. Leita Sanders, MD Advanced Bronchoscopy PCCM [facility]    *This note was generated using voice recognition software/Dragon and/or AI transcription program.  Despite best efforts to  proofread, errors can occur which can change the meaning. Any transcriptional errors that result from this process are unintentional and may not be fully corrected at the time of dictation.

## 2023-10-27 ENCOUNTER — Other Ambulatory Visit
Admission: RE | Admit: 2023-10-27 | Discharge: 2023-10-27 | Disposition: A | Discharge status: Home / Self Care | Source: Ambulatory Visit | Attending: Pulmonary Disease | Admitting: Pulmonary Disease

## 2023-10-27 ENCOUNTER — Telehealth: Payer: Self-pay

## 2023-10-27 ENCOUNTER — Ambulatory Visit: Admitting: Pulmonary Disease

## 2023-10-27 ENCOUNTER — Encounter: Payer: Self-pay | Admitting: Pulmonary Disease

## 2023-10-27 ENCOUNTER — Other Ambulatory Visit (HOSPITAL_COMMUNITY): Payer: Self-pay

## 2023-10-27 ENCOUNTER — Other Ambulatory Visit: Payer: Self-pay

## 2023-10-27 VITALS — BP 142/90 | HR 83 | Temp 97.9°F | Ht 62.5 in | Wt 189.6 lb

## 2023-10-27 DIAGNOSIS — J4540 Moderate persistent asthma, uncomplicated: Secondary | ICD-10-CM

## 2023-10-27 DIAGNOSIS — D869 Sarcoidosis, unspecified: Secondary | ICD-10-CM

## 2023-10-27 DIAGNOSIS — J4551 Severe persistent asthma with (acute) exacerbation: Secondary | ICD-10-CM

## 2023-10-27 DIAGNOSIS — R0602 Shortness of breath: Secondary | ICD-10-CM

## 2023-10-27 DIAGNOSIS — J329 Chronic sinusitis, unspecified: Secondary | ICD-10-CM

## 2023-10-27 LAB — CBC WITH DIFFERENTIAL/PLATELET
Abs Immature Granulocytes: 0.06 K/uL (ref 0.00–0.07)
Basophils Absolute: 0.1 K/uL (ref 0.0–0.1)
Basophils Relative: 1 %
Eosinophils Absolute: 0.5 K/uL (ref 0.0–0.5)
Eosinophils Relative: 5 %
HCT: 43.2 % (ref 36.0–46.0)
Hemoglobin: 13.5 g/dL (ref 12.0–15.0)
Immature Granulocytes: 1 %
Lymphocytes Relative: 31 %
Lymphs Abs: 3.5 K/uL (ref 0.7–4.0)
MCH: 24.7 pg — ABNORMAL LOW (ref 26.0–34.0)
MCHC: 31.3 g/dL (ref 30.0–36.0)
MCV: 79.1 fL — ABNORMAL LOW (ref 80.0–100.0)
Monocytes Absolute: 0.7 K/uL (ref 0.1–1.0)
Monocytes Relative: 6 %
Neutro Abs: 6.5 K/uL (ref 1.7–7.7)
Neutrophils Relative %: 56 %
Platelets: 310 K/uL (ref 150–400)
RBC: 5.46 MIL/uL — ABNORMAL HIGH (ref 3.87–5.11)
RDW: 15.5 % (ref 11.5–15.5)
WBC: 11.4 K/uL — ABNORMAL HIGH (ref 4.0–10.5)
nRBC: 0 % (ref 0.0–0.2)

## 2023-10-27 LAB — NITRIC OXIDE: Nitric Oxide: 52

## 2023-10-27 MED ORDER — DUPIXENT 300 MG/2ML ~~LOC~~ SOAJ
SUBCUTANEOUS | 0 refills | Status: DC
  Filled 2023-10-29 – 2023-10-30 (×2): qty 8, 28d supply, fill #0

## 2023-10-27 MED ORDER — PREDNISONE 20 MG PO TABS
20.0000 mg | ORAL_TABLET | Freq: Every day | ORAL | 0 refills | Status: AC
  Filled 2023-10-27: qty 5, 5d supply, fill #0

## 2023-10-27 NOTE — Telephone Encounter (Signed)
 Scheduled for Dupixent  new start on 11/03/23. She is aware of different location than where she sees Dr. Tamea - address for [address] provided.   Aleck Puls, PharmD, BCPS Clinical Pharmacist  [facility]

## 2023-10-27 NOTE — Progress Notes (Signed)
 Subjective:    Patient ID: Meghan Leon, female    DOB: 1955-08-24, 68 y.o.   MRN: [id number]  Patient Care Team: Wendee Lynwood HERO, NP as PCP - General (Nurse Practitioner) Portia Fireman, OD (Optometry)  Chief Complaint  Patient presents with   Asthma    Cough due to post nasal drip. No wheezing.     BACKGROUND/INTERVAL: Meghan Leon presents for follow-up last seen here as an acute visit on 21 September 2023 due to acute bronchitis.  She has sarcoidosis and moderate persistent asthma.  This is a scheduled follow-up visit.  HPI Discussed the use of AI scribe software for clinical note transcription with the patient, who gave verbal consent to proceed.  History of Present Illness   Meghan Leon is a 68 year old female with sarcoidosis and moderate to severe persistent asthma who presents for follow-up after an acute visit for bronchitis and asthma exacerbation on 21 September 2023.  She continues to experience coughing, which she attributes to sinus drainage, despite having some good days after taking medication (doxycycline  and Medrol  Dosepak). Her symptoms seem to worsen after cutting the grass, even though she wears a mask. She does not currently take allergy pills before this activity but uses fluticasone  nasal spray.  She has a history of asthma exacerbations, with a recent nitric oxide  level of 52. She has been on prednisone  previously and is currently using her Trelegy 200 mcg inhaler 1 puff daily as well as rescue albuterol . She recalls a remote past exposure to a chemical used for pest control, which she believes may have caused permanent lung damage.  She has undergone allergy testing in the past, which showed sensitivity to three types of mold. She has taken steps to improve her home environment following these results. She mentions a family history of similar respiratory issues, with her sister and niece also affected.  No smoking. Reports ongoing cough and sinus drainage.       DATA 19 July 2019 PFTs: FEV1 1.19 L or 60% predicted, FVC 1.78 L or 70% predicted.  Postbronchodilator there is a 21% net change on FEV1 to 1.45 L or 73% predicted and equally on 19% change in FVC demonstrating decreased air trapping.  Lung volumes are mildly reduced consistent with the patient's sarcoidosis.  Diffusion capacity mildly reduced when alveolar volume is taken into account. 19 July 2019 CT scan of the chest:  shows calcified mediastinal lymphadenopathy consistent with sarcoidosis and fibrotic changes on the right upper lobe clustered perilymphatic and perifissural nodules consistent with sarcoidosis.  The findings are stable. 19 January 2020 CT scan of the chest: Changes of sarcoidosis with one area slightly enlarged however still consistent with sarcoidosis. 08/10/2020 CT chest without contrast: Persistent changes consistent with sarcoidosis. 08/12/2021 CT chest without contrast mediastinal/hilar pulmonary parenchymal findings of sarcoid with mild progression in the lungs from CT of 08/10/2020 enlarged pulmonary trunk consistent pulmonary arterial hypertension. 09/11/2021 echocardiogram: LVEF 55 to 60% grade 1 DD.  Trivial mitral valve regurgitation, thickening of the aortic valve without aortic stenosis. 08/19/2022 CT chest without contrast: Redemonstrated findings consistent with pulmonary sarcoidosis involving confluent interstitial disease in the right upper lobe and superior right lower lobe.  Unchanged perilymphatic nodularity.  Enlarged calcified mediastinal and bilateral hilar lymph nodes unchanged when compared to prior exam consistent with sarcoidosis. 11/20/2022 PFTs: FEV1 1.33 L or 57% predicted, FVC 1.95 L or 64% predicted, FEV1/FVC 68%, lung volumes normal to mildly reduced, diffusion capacity mildly reduced but corrects for  alveolar volume.  Overall no significant change when compared to study performed 19 July 2019. 11/24/2022: ACE level: 73, normal. 09/21/2023 CXR PA and  lateral: No acute process, chronic bilateral hilar adenopathy and scarring consistent with sarcoidosis unchanged from prior.  Review of Systems A 10 point review of systems was performed and it is as noted above otherwise negative.   Patient Active Problem Leon   Diagnosis Date Noted   Prediabetes 01/29/2023   Preventative health care 02/11/2022   Obesity (BMI 30-39.9) 02/11/2022   Sarcoidosis 08/04/2019   Essential hypertension 04/06/2019   Asthma 04/06/2019   Hyperlipidemia 04/06/2019    Social History   Tobacco Use   Smoking status: Never   Smokeless tobacco: Never   Tobacco comments:    never  Substance Use Topics   Alcohol use: No    No Known Allergies  Current Meds  Medication Sig   albuterol  (PROVENTIL ) (2.5 MG/3ML) 0.083% nebulizer solution Take 3 mLs (2.5 mg total) by nebulization every 6 (six) hours as needed for wheezing or shortness of breath.   albuterol  (VENTOLIN  HFA) 108 (90 Base) MCG/ACT inhaler Inhale 2 puffs into the lungs every 6 (six) hours as needed for wheezing or shortness of breath.   amLODipine  (NORVASC ) 5 MG tablet Take 1 tablet (5 mg total) by mouth daily.   Fluticasone -Umeclidin-Vilant (TRELEGY ELLIPTA ) 200-62.5-25 MCG/ACT AEPB Inhale 1 puff into the lungs daily.   predniSONE  (DELTASONE ) 20 MG tablet Take 1 tablet (20 mg total) by mouth daily with breakfast for 5 days.    Immunization History  Administered Date(s) Administered   Fluad Quad(high Dose 65+) 12/12/2021   Influenza-Unspecified 12/08/2018, 12/15/2019, 12/16/2022   PFIZER(Purple Top)SARS-COV-2 Vaccination 03/11/2019, 04/01/2019, 02/07/2020   PNEUMOCOCCAL CONJUGATE-20 02/11/2022   Pfizer Covid-19 Vaccine Bivalent Booster 3yrs & up 03/22/2021   Tdap 06/03/2005, 11/13/2020   Zoster Recombinant(Shingrix) 11/09/2019, 01/18/2020        Objective:     BP (!) 142/90   Pulse 83   Temp 97.9 F (36.6 C) (Temporal)   Ht 5' 2.5 (1.588 m)   Wt 189 lb 9.6 oz (86 kg)   SpO2 97%    BMI 34.13 kg/m   SpO2: 97 %  GENERAL: Awake, alert, fully ambulatory.  No respiratory distress.  No conversational dyspnea.  Significant nasal quality to the speech. HEAD: Normocephalic, atraumatic.  EYES: Pupils equal, round, reactive to light.  No scleral icterus.  MOUTH: Dentition intact, oral mucosa moist.  No thrush. NECK: Supple. No thyromegaly. Trachea midline. No JVD.  No adenopathy. PULMONARY: Good air entry bilaterally.  No rhonchi, faint end expiratory wheezes noted particularly on the right upper lung field. CARDIOVASCULAR: S1 and S2. Regular rate and rhythm.  No rubs murmurs or gallops heard. GASTROINTESTINAL: No abdominal distention noted. MUSCULOSKELETAL: No joint deformity, no clubbing, no edema.  NEUROLOGIC: No focal deficit, no gait disturbance, speech is fluent. SKIN: Intact,warm,dry.  Limited exam shows no rashes. PSYCH: Mood and behavior normal.  Lab Results  Component Value Date   NITRICOXIDE 52 10/27/2023  *There is evidence of significant type II inflammation present *Trend 39>>55>>34>>52 ppb    Assessment & Plan:     ICD-10-CM   1. Severe persistent asthma with acute exacerbation  J45.51 Nitric oxide     CBC w/Diff    2. Shortness of breath  R06.02 Nitric oxide     3. Chronic rhinosinusitis  J32.9     4. Sarcoidosis  D86.9 Angiotensin converting enzyme      Orders Placed This Encounter  Procedures   CBC w/Diff    Standing Status:   Future    Expiration Date:   10/26/2024   Angiotensin converting enzyme    Standing Status:   Future    Expiration Date:   10/26/2024   Nitric oxide     Meds ordered this encounter  Medications   predniSONE  (DELTASONE ) 20 MG tablet    Sig: Take 1 tablet (20 mg total) by mouth daily with breakfast for 5 days.    Dispense:  5 tablet    Refill:  0   Discussion:    Severe persistent asthma with acute exacerbation Asthma exacerbation persists, indicated by elevated nitric oxide  level of 52 and persistent wheezing.  Biologic therapy is considered for better symptom control, with options for injections administered twice a month or once a month. Biologic therapy targets cells causing allergic reactions, unlike traditional allergy shots. Insurance coverage and cost considerations were discussed. - Order blood work to check eosinophil levels - Prescribe a short course of prednisone  for five days - Initiate paperwork for biologic therapy (Dupixent  chosen) - Coordinate with pharmacy team to determine insurance coverage and cost for biologic therapy - Educate her on self-administration of biologic injections  Sarcoidosis Sarcoidosis is a chronic condition with unknown etiology, possibly related to environmental exposures or genetic factors.  Overall your sarcoidosis has been stable. - Check angiotensin-converting enzyme level for monitoring  Chronic cough and chronic rhinitis Chronic cough and rhinitis symptoms persist, likely exacerbated by sinus drainage and environmental factors such as grass cutting. Nasal congestion is present. Current management includes fluticasone  nasal spray. - Recommend taking Claritin before exposure to allergens - Continue fluticasone  nasal spray     Will see the patient in follow-up in 4 to 6 weeks she is to contact us  prior to that time should any new difficulties arise.  Advised if symptoms do not improve or worsen, to please contact office for sooner follow up or seek emergency care.    I spent 42 minutes of dedicated to the care of this patient on the date of this encounter to include pre-visit review of records, face-to-face time with the patient discussing conditions above, post visit ordering of testing, clinical documentation with the electronic health record, making appropriate referrals as documented, and communicating necessary findings to members of the patients care team.     C. Leita Sanders, MD Advanced Bronchoscopy PCCM [facility]    *This  note was generated using voice recognition software/Dragon and/or AI transcription program.  Despite best efforts to proofread, errors can occur which can change the meaning. Any transcriptional errors that result from this process are unintentional and may not be fully corrected at the time of dictation.

## 2023-10-27 NOTE — Telephone Encounter (Signed)
 Submitted a Prior Authorization request to [employer] for DUPIXENT  via CoverMyMeds. Will update once we receive a response.  Key: [id number]

## 2023-10-27 NOTE — Telephone Encounter (Signed)
 Received notification from [employer] regarding a prior authorization for DUPIXENT . Authorization has been APPROVED from 10/27/23 to 04/25/24. Approval letter sent to scan center.  Per test claim, copay for 28 days supply is $0  Authorization # [id number] Phone # [phone]

## 2023-10-27 NOTE — Patient Instructions (Signed)
 VISIT SUMMARY:  Today, you came in for a follow-up visit after experiencing bronchitis. We discussed your ongoing symptoms, including coughing and sinus drainage, and reviewed your history of asthma and sarcoidosis. We also talked about your recent asthma exacerbation and potential new treatment options.  YOUR PLAN:  -SEVERE PERSISTENT ASTHMA WITH ACUTE EXACERBATION: Your asthma symptoms have worsened, as shown by your elevated nitric oxide  level and persistent wheezing. We are considering biologic therapy, which targets the cells causing allergic reactions. This therapy involves injections that can be given either twice a month or once a month. We will start by ordering blood work to check your eosinophil levels and prescribe a short course of prednisone  for five days. We will also begin the paperwork for biologic therapy and coordinate with the pharmacy team to determine insurance coverage and cost. You will be educated on how to self-administer the biologic injections.  -SARCOIDOSIS: Sarcoidosis is a chronic condition that may be related to environmental exposures or genetic factors. We will continue to monitor your condition and manage your symptoms as needed.  -CHRONIC COUGH AND CHRONIC RHINITIS: Your chronic cough and nasal congestion are likely worsened by sinus drainage and environmental factors like grass cutting. We recommend taking Claritin before exposure to allergens and continuing to use the fluticasone  nasal spray to help manage your symptoms.  INSTRUCTIONS:  Please follow up with the blood work to check your eosinophil levels and complete the short course of prednisone  as prescribed. We will also be in touch regarding the next steps for biologic therapy, including insurance coverage and cost. Continue using the fluticasone  nasal spray and start taking Claritin before exposure to allergens. If you have any questions or concerns, please do not hesitate to contact our office.

## 2023-10-27 NOTE — Telephone Encounter (Signed)
 Patient seen in office today by Dr. Tamea, and ordered Dupixent .  Patient has signed forms and will be faxed to pharmacy team.

## 2023-10-28 LAB — ANGIOTENSIN CONVERTING ENZYME: Angiotensin-Converting Enzyme: 70 U/L (ref 14–82)

## 2023-10-29 ENCOUNTER — Other Ambulatory Visit: Payer: Self-pay

## 2023-10-29 NOTE — Progress Notes (Signed)
 Specialty Pharmacy Initial Fill Coordination Note  Meghan Leon is a 68 y.o. female contacted today regarding initial fill of specialty medication(s) Dupilumab  (Dupixent )   Patient requested Courier to Provider Office   Delivery date: 11/02/23   Verified address: [address], [place], [place] [address]   Medication will be filled on 9/12.   Patient is aware of $0 copayment.

## 2023-10-30 ENCOUNTER — Other Ambulatory Visit: Payer: Self-pay

## 2023-10-30 ENCOUNTER — Other Ambulatory Visit (HOSPITAL_COMMUNITY): Payer: Self-pay

## 2023-10-30 NOTE — Telephone Encounter (Signed)
 The pharmacy team did receive the forms and opened another encounter.  /Nothing further needed.

## 2023-11-03 ENCOUNTER — Ambulatory Visit

## 2023-11-03 ENCOUNTER — Other Ambulatory Visit: Payer: Self-pay

## 2023-11-03 DIAGNOSIS — Z7189 Other specified counseling: Secondary | ICD-10-CM

## 2023-11-03 DIAGNOSIS — J4540 Moderate persistent asthma, uncomplicated: Secondary | ICD-10-CM

## 2023-11-03 MED ORDER — DUPIXENT 300 MG/2ML ~~LOC~~ SOAJ
300.0000 mg | SUBCUTANEOUS | 2 refills | Status: DC
  Filled 2023-11-03 – 2023-11-19 (×2): qty 4, 28d supply, fill #0
  Filled 2024-01-01 – 2024-01-12 (×3): qty 4, 28d supply, fill #1
  Filled 2024-02-08: qty 4, 28d supply, fill #2

## 2023-11-03 NOTE — Patient Instructions (Signed)
 Your next Dupixent  dose is due on 11/17/23 and every 14 days thereafter. Keep the medication in the refrigerator. If necessary, it can be stored at room temperature for no longer than 14 days. If you miss a dose, take it as soon as you remember. If it has been more than 7 days since your missed dose, skip that dose and continue with your usual schedule. Do not double dose the medication to make up for missed dose.  CONTINUE Trelegy 200-62.5-25 mcg/act (Inhale 1 puff into the lungs daily)  Your prescription will be shipped from [facility]. Their phone number is [phone]. Someone will call to schedule shipment and confirm address. They will mail your medication to your home.  You will need to be seen by your provider in 3 to 4 months to assess how Dupixent  is working for you. Please keep your follow-up appointment scheduled on 12/18/23 with Dr. Tamea.   Stay up to date on all routine vaccines: influenza, pneumonia, COVID19, Shingles  How to manage an injection site reaction: Remember the 5 C's: COUNTER - leave on the counter at least 30 minutes but up to overnight to bring medication to room temperature. This may help prevent stinging COLD - place something cold (like an ice gel pack or cold water bottle) on the injection site just before cleansing with alcohol. This may help reduce pain CLARITIN - use Claritin (generic name is loratadine) for the first two weeks of treatment or the day of, the day before, and the day after injecting. This will help to minimize injection site reactions CORTISONE CREAM - apply if injection site is irritated and itching CALL ME - if injection site reaction is bigger than the size of your fist, looks infected, blisters, or if you develop hives

## 2023-11-03 NOTE — Progress Notes (Signed)
 HPI Patient presents today to  Pulmonary to see pharmacy team for Dupixent  new start.  Past medical history includes sarcoidosis (stable) and severe persistent asthma, followed by Dr. Tamea. Last OV 10/27/23 at which time she presented with acute exacerbation and plan was to initiate biologic agent.   Respiratory Medications Current regimen: Trelegy 200-62.5-25 mcg/act (Inhale 1 puff into the lungs daily), Proventil  2.5mg /34mL 0.083% nebulizer solution (Take 3 mLs by nebulization every 6 hours PRN wheezing or SOB), Ventolin  108 mcg/act (Inhale 2 puffs into the lungs every 6 hours PRN wheezing or SOB)  Patient reports no known adherence challenges  OBJECTIVE No Known Allergies  Outpatient Encounter Medications as of 11/03/2023  Medication Sig   albuterol  (PROVENTIL ) (2.5 MG/3ML) 0.083% nebulizer solution Take 3 mLs (2.5 mg total) by nebulization every 6 (six) hours as needed for wheezing or shortness of breath.   albuterol  (VENTOLIN  HFA) 108 (90 Base) MCG/ACT inhaler Inhale 2 puffs into the lungs every 6 (six) hours as needed for wheezing or shortness of breath.   amLODipine  (NORVASC ) 5 MG tablet Take 1 tablet (5 mg total) by mouth daily.   Dupilumab  (DUPIXENT ) 300 MG/2ML SOAJ Inject contents of two pens (600mg ) in the skin on day 0 in clinic. Then, inject contents of one pen (300mg ) on day 14 and every 14 days thereafter. Courier to pulm: [address], [address], [place] [address]. Appt on 11/03/23.   Fluticasone -Umeclidin-Vilant (TRELEGY ELLIPTA ) 200-62.5-25 MCG/ACT AEPB Inhale 1 puff into the lungs daily.   No facility-administered encounter medications on file as of 11/03/2023.     Immunization History  Administered Date(s) Administered   Fluad Quad(high Dose 65+) 12/12/2021   Influenza-Unspecified 12/08/2018, 12/15/2019, 12/16/2022   PFIZER(Purple Top)SARS-COV-2 Vaccination 03/11/2019, 04/01/2019, 02/07/2020   PNEUMOCOCCAL CONJUGATE-20 02/11/2022   Pfizer Covid-19  Vaccine Bivalent Booster 88yrs & up 03/22/2021   Tdap 06/03/2005, 11/13/2020   Zoster Recombinant(Shingrix) 11/09/2019, 01/18/2020     PFTs    Latest Ref Rng & Units 11/20/2022    3:35 PM  PFT Results  FVC-Pre L 1.95   FVC-Predicted Pre % 64   FVC-Post L 2.08   FVC-Predicted Post % 68   Pre FEV1/FVC % % 68   Post FEV1/FCV % % 68   FEV1-Pre L 1.33   FEV1-Predicted Pre % 57   FEV1-Post L 1.41   DLCO uncorrected ml/min/mmHg 13.70   DLCO UNC% % 70   DLVA Predicted % 91   TLC L 4.17   TLC % Predicted % 83   RV % Predicted % 92      Eosinophils Most recent blood eosinophil count was 50 cells/microL taken on 08/23/2019.   IgE: 500 on 10/27/23   Assessment   Biologics training for dupilumab  (Dupixent )  Goals of therapy: Mechanism: human monoclonal IgG4 antibody that inhibits interleukin-4 and interleukin-13 cytokine-induced responses, including release of proinflammatory cytokines, chemokines, and IgE Reviewed that Dupixent  is add-on medication and patient must continue maintenance inhaler regimen. Response to therapy: may take 4 months to determine efficacy. Discussed that patients generally feel improvement sooner than 4 months.  Side effects: injection site reaction (6-18%), antibody development (5-16%), ophthalmic conjunctivitis (2-16%), transient blood eosinophilia (1-2%)  Dose: 600mg  at Week 0 (administered today in clinic) followed by 300mg  every 14 days thereafter  Administration/Storage:  Reviewed administration sites of thigh or abdomen (at least 2-3 inches away from abdomen). Reviewed the upper arm is only appropriate if caregiver is administering injection  Do not shake pen/syringe as this could  lead to product foaming or precipitation. Do not use if solution is discolored or contains particulate matter or if window on prefilled pen is yellow (indicates pen has been used).  Reviewed storage of medication in refrigerator. Reviewed that Dupixent  can be stored at room  temperature in unopened carton for up to 14 days.  Access: Approval of Dupixent  through: insurance  Patient self-administered Dupixent  300mg /22ml x 2 (total dose 600mg ) in right lower abdomen and left lower abdomen using sample  Dupixent  300mg /23mL autoinjector pen NDC: 0024-5915-02 Lot: [id number] Expiration: 2025-11-16  Patient monitored for 30 minutes for adverse reaction.  Patient tolerated well.  Injection site noted. Patient denies itchiness and irritation at injection.  Medication Reconciliation  A drug regimen assessment was performed, including review of allergies, interactions, disease-state management, dosing and immunization history. Medications were reviewed with the patient, including name, instructions, indication, goals of therapy, potential side effects, importance of adherence, and safe use.  Drug interaction(s): none noted   PLAN Continue Dupixent  300mg  every 14 days.  Next dose is due 11/17/23 and every 14 days thereafter. Rx sent to: [facility]: [phone] .  Patient provided with pharmacy phone number.  Continue maintenance inhaler regimen of: Trelegy 200-62.5-25 mcg/act (Inhale 1 puff into the lungs daily) Follow-up with Dr. Tamea as planned on 12/18/23.   All questions encouraged and answered.  Instructed patient to reach out with any further questions or concerns.  Thank you for allowing pharmacy to participate in this patient's care.  This appointment required 45 minutes of patient care (this includes precharting, chart review, review of results, face-to-face care, etc.).

## 2023-11-16 ENCOUNTER — Other Ambulatory Visit: Payer: Self-pay | Admitting: Pulmonary Disease

## 2023-11-17 ENCOUNTER — Other Ambulatory Visit: Payer: Self-pay

## 2023-11-17 MED ORDER — ALBUTEROL SULFATE HFA 108 (90 BASE) MCG/ACT IN AERS
2.0000 | INHALATION_SPRAY | Freq: Four times a day (QID) | RESPIRATORY_TRACT | 11 refills | Status: AC | PRN
  Filled 2023-11-17: qty 8.5, 25d supply, fill #0

## 2023-11-19 ENCOUNTER — Other Ambulatory Visit: Payer: Self-pay | Admitting: Pharmacy Technician

## 2023-11-19 ENCOUNTER — Encounter (INDEPENDENT_AMBULATORY_CARE_PROVIDER_SITE_OTHER): Payer: Self-pay

## 2023-11-19 ENCOUNTER — Encounter: Payer: Self-pay | Admitting: Nurse Practitioner

## 2023-11-19 ENCOUNTER — Other Ambulatory Visit: Payer: Self-pay

## 2023-11-19 ENCOUNTER — Other Ambulatory Visit: Payer: Self-pay | Admitting: Nurse Practitioner

## 2023-11-19 DIAGNOSIS — Z1231 Encounter for screening mammogram for malignant neoplasm of breast: Secondary | ICD-10-CM

## 2023-11-19 NOTE — Progress Notes (Signed)
 Specialty Pharmacy Refill Coordination Note  Meghan Leon is a 68 y.o. female contacted today regarding refills of specialty medication(s) Dupilumab  (Dupixent )   Patient requested (Patient-Rptd) Delivery   Delivery date: 12/02/23 Verified address: (Patient-Rptd) [address]  [place] [address]   Medication will be filled on 12/01/23.

## 2023-11-27 NOTE — Progress Notes (Signed)
 Patient was counseled on Dupixent  prior to first injection on 11/03/23 in clinic. Able to self-administer. Tolerated well.   PLAN Continue Dupixent  300mg  every 14 days.  Next dose is due 11/17/23 and every 14 days thereafter. Rx sent to: [facility]: [id number] .  Patient provided with pharmacy phone number.  Continue maintenance inhaler regimen of: Trelegy 200-62.5-25 mcg/act (Inhale 1 puff into the lungs daily) Follow-up with Dr. Tamea as planned on 12/18/23.    All questions encouraged and answered.  Instructed patient to reach out with any further questions or concerns.  Aleck Puls, PharmD, BCPS, CPP Clinical Pharmacist  [facility]

## 2023-12-03 DIAGNOSIS — H2513 Age-related nuclear cataract, bilateral: Secondary | ICD-10-CM | POA: Diagnosis not present

## 2023-12-03 DIAGNOSIS — H524 Presbyopia: Secondary | ICD-10-CM | POA: Diagnosis not present

## 2023-12-18 ENCOUNTER — Encounter: Payer: Self-pay | Admitting: Pulmonary Disease

## 2023-12-18 ENCOUNTER — Ambulatory Visit: Admitting: Pulmonary Disease

## 2023-12-18 VITALS — BP 136/84 | HR 75 | Temp 97.9°F | Ht 62.5 in | Wt 193.0 lb

## 2023-12-18 DIAGNOSIS — J329 Chronic sinusitis, unspecified: Secondary | ICD-10-CM

## 2023-12-18 DIAGNOSIS — J4550 Severe persistent asthma, uncomplicated: Secondary | ICD-10-CM

## 2023-12-18 DIAGNOSIS — D869 Sarcoidosis, unspecified: Secondary | ICD-10-CM | POA: Diagnosis not present

## 2023-12-18 LAB — NITRIC OXIDE: Nitric Oxide: 39

## 2023-12-18 NOTE — Progress Notes (Signed)
 Subjective:    Patient ID: Meghan Leon, female    DOB: Jun 27, 1955, 68 y.o.   MRN: [id number]  Patient Care Team: Wendee Lynwood HERO, NP as PCP - General (Nurse Practitioner) Portia Fireman, OD [facility])  Chief Complaint  Patient presents with   Asthma    BACKGROUND/INTERVAL:  HPI Discussed the use of AI scribe software for clinical note transcription with the patient, who gave verbal consent to proceed.  History of Present Illness   Meghan Leon is a 68 year old female with asthma who presents for follow-up on her asthma management.  She also has underlying sarcoidosis which adds complexity to her management.  Her nitric oxide  levels have decreased from 53 to 39. She has been receiving Dupixent  injections and has completed four shots (2 months). It may take four to six months to notice significant improvement, although some patients experience benefits sooner.  She feels that she is better controlled on the Dupixent .  She experiences sinus congestion, particularly at night, but denies wheezing. She has a nasal spray, possibly Flonase , but has not used it recently. She is aware of other over-the-counter options like Nasonex and Rhinocort .  She received her flu shot last Tuesday and ensured it was not administered on the same day as her Dupixent  injection.      DATA 19 July 2019 PFTs: FEV1 1.19 L or 60% predicted, FVC 1.78 L or 70% predicted.  Postbronchodilator there is a 21% net change on FEV1 to 1.45 L or 73% predicted and equally on 19% change in FVC demonstrating decreased air trapping.  Lung volumes are mildly reduced consistent with the patient's sarcoidosis.  Diffusion capacity mildly reduced when alveolar volume is taken into account. 19 July 2019 CT scan of the chest:  shows calcified mediastinal lymphadenopathy consistent with sarcoidosis and fibrotic changes on the right upper lobe clustered perilymphatic and perifissural nodules consistent with sarcoidosis.  The  findings are stable. 19 January 2020 CT scan of the chest: Changes of sarcoidosis with one area slightly enlarged however still consistent with sarcoidosis. 08/10/2020 CT chest without contrast: Persistent changes consistent with sarcoidosis. 08/12/2021 CT chest without contrast mediastinal/hilar pulmonary parenchymal findings of sarcoid with mild progression in the lungs from CT of 08/10/2020 enlarged pulmonary trunk consistent pulmonary arterial hypertension. 09/11/2021 echocardiogram: LVEF 55 to 60% grade 1 DD.  Trivial mitral valve regurgitation, thickening of the aortic valve without aortic stenosis. 08/19/2022 CT chest without contrast: Redemonstrated findings consistent with pulmonary sarcoidosis involving confluent interstitial disease in the right upper lobe and superior right lower lobe.  Unchanged perilymphatic nodularity.  Enlarged calcified mediastinal and bilateral hilar lymph nodes unchanged when compared to prior exam consistent with sarcoidosis. 11/20/2022 PFTs: FEV1 1.33 L or 57% predicted, FVC 1.95 L or 64% predicted, FEV1/FVC 68%, lung volumes normal to mildly reduced, diffusion capacity mildly reduced but corrects for alveolar volume.  Overall no significant change when compared to study performed 19 July 2019. 11/24/2022: ACE level: 73, normal. 09/21/2023 CXR PA and lateral: No acute process, chronic bilateral hilar adenopathy and scarring consistent with sarcoidosis unchanged from prior. 10/27/2023 ACE level: 70 units/L, normal.  Review of Systems A 10 point review of systems was performed and it is as noted above otherwise negative.   Patient Active Problem Leon   Diagnosis Date Noted   Prediabetes 01/29/2023   Preventative health care 02/11/2022   Obesity (BMI 30-39.9) 02/11/2022   Sarcoidosis 08/04/2019   Essential hypertension 04/06/2019   Asthma 04/06/2019   Hyperlipidemia 04/06/2019  Social History   Tobacco Use   Smoking status: Never   Smokeless tobacco:  Never   Tobacco comments:    never  Substance Use Topics   Alcohol use: No    No Known Allergies  Current Meds  Medication Sig   albuterol  (PROVENTIL ) (2.5 MG/3ML) 0.083% nebulizer solution Take 3 mLs (2.5 mg total) by nebulization every 6 (six) hours as needed for wheezing or shortness of breath.   albuterol  (VENTOLIN  HFA) 108 (90 Base) MCG/ACT inhaler Inhale 2 puffs into the lungs every 6 (six) hours as needed for wheezing or shortness of breath.   amLODipine  (NORVASC ) 5 MG tablet Take 1 tablet (5 mg total) by mouth daily.   Dupilumab  (DUPIXENT ) 300 MG/2ML SOAJ Inject 300 mg into the skin every 14 (fourteen) days.   Fluticasone -Umeclidin-Vilant (TRELEGY ELLIPTA ) 200-62.5-25 MCG/ACT AEPB Inhale 1 puff into the lungs daily.    Immunization History  Administered Date(s) Administered   Fluad Quad(high Dose 65+) 12/12/2021   Influenza-Unspecified 12/08/2018, 12/15/2019, 12/16/2022   PFIZER(Purple Top)SARS-COV-2 Vaccination 03/11/2019, 04/01/2019, 02/07/2020   PNEUMOCOCCAL CONJUGATE-20 02/11/2022   Pfizer Covid-19 Vaccine Bivalent Booster 62yrs & up 03/22/2021   Tdap 06/03/2005, 11/13/2020   Zoster Recombinant(Shingrix) 11/09/2019, 01/18/2020        Objective:     BP 136/84   Pulse 75   Temp 97.9 F (36.6 C) (Temporal)   Ht 5' 2.5 (1.588 m)   Wt 193 lb (87.5 kg)   SpO2 97%   BMI 34.74 kg/m   SpO2: 97 %  GENERAL: Awake, alert, fully ambulatory.  No respiratory distress.  No conversational dyspnea.  Significant nasal quality to the speech. HEAD: Normocephalic, atraumatic.  EYES: Pupils equal, round, reactive to light.  No scleral icterus.  MOUTH: Dentition intact, oral mucosa moist.  No thrush. NECK: Supple. No thyromegaly. Trachea midline. No JVD.  No adenopathy. PULMONARY: Good air entry bilaterally.  Coarse otherwise, no adventitious sounds.   CARDIOVASCULAR: S1 and S2. Regular rate and rhythm.  No rubs murmurs or gallops heard. GASTROINTESTINAL: No abdominal  distention noted. MUSCULOSKELETAL: No joint deformity, no clubbing, no edema.  NEUROLOGIC: No focal deficit, no gait disturbance, speech is fluent. SKIN: Intact,warm,dry.  Limited exam shows no rashes. PSYCH: Mood and behavior normal.  Lab Results  Component Value Date   NITRICOXIDE 39 12/18/2023  *There is evidence of intermediate type II inflammation present *Trend 39>>55>>34>>52 >> 39 ppb   Assessment & Plan:     ICD-10-CM   1. Severe persistent asthma, unspecified whether complicated (HCC)  J45.50 Nitric oxide     2. Chronic rhinosinusitis  J32.9     3. Sarcoidosis  D86.9       Orders Placed This Encounter  Procedures   Nitric oxide    Discussion:    Asthma Inflammation is decreasing, as indicated by a reduction in inflammation markers from 53 to 39. No wheezing is present. Dupixent  is being used to manage inflammation, with expected improvement over 4 to 6 months, though some may notice benefits sooner. Environmental factors such as mold and vegetation contribute to inflammation. - Continue Dupixent  injections. - Scheduled follow-up in 3 months.  Allergic rhinitis Reports sinus congestion, particularly at night. Has not used Flonase  recently. Over-the-counter nasal sprays like Nasonex, Rhinocort , and Flonase  are available and effective for managing symptoms. - Consider using over-the-counter nasal sprays such as Nasonex, Rhinocort , or Flonase  for sinus congestion.      Advised if symptoms do not improve or worsen, to please contact office for sooner follow up  or seek emergency care.    I spent 32 minutes of dedicated to the care of this patient on the date of this encounter to include pre-visit review of records, face-to-face time with the patient discussing conditions above, post visit ordering of testing, clinical documentation with the electronic health record, making appropriate referrals as documented, and communicating necessary findings to members of the patients  care team.     C. Leita Sanders, MD Advanced Bronchoscopy PCCM [facility]    *This note was generated using voice recognition software/Dragon and/or AI transcription program.  Despite best efforts to proofread, errors can occur which can change the meaning. Any transcriptional errors that result from this process are unintentional and may not be fully corrected at the time of dictation.

## 2023-12-18 NOTE — Patient Instructions (Signed)
 VISIT SUMMARY:  Today, we reviewed your asthma management and discussed your sinus congestion. Your inflammation marker has decreased, indicating that your current treatment is working. You received your flu shot last week, and we ensured it was not given on the same day as your Dupixent  injection.  YOUR PLAN:  -ASTHMA: Asthma is a condition where your airways become inflamed and narrow, making it hard to breathe. Your inflammation marker has decreased from 53 to 39, which is a good sign. You are currently using Dupixent  injections to manage the inflammation, and it may take 4 to 6 months to see significant improvement. Continue with your Dupixent  injections as prescribed. We will have a follow-up appointment in 3 months to monitor your progress.  -ALLERGIC RHINITIS: Allergic rhinitis is an allergic reaction that causes sneezing, congestion, and a runny nose. You have been experiencing sinus congestion, especially at night. You have not used your nasal spray recently. Over-the-counter nasal sprays like Nasonex, Rhinocort , and Flonase  can help manage your symptoms. Consider using one of these sprays to relieve your sinus congestion.  INSTRUCTIONS:  Please continue with your Dupixent  injections as prescribed and consider using an over-the-counter nasal spray for your sinus congestion. We will have a follow-up appointment in 3 months to monitor your asthma progress.

## 2023-12-22 ENCOUNTER — Other Ambulatory Visit: Payer: Self-pay

## 2024-01-01 ENCOUNTER — Other Ambulatory Visit: Payer: Self-pay

## 2024-01-04 ENCOUNTER — Encounter: Payer: Self-pay | Admitting: Pulmonary Disease

## 2024-01-04 NOTE — Telephone Encounter (Signed)
 Can we reassure the patient that she is covered through March and then process can be restarted.  Also alert our pharmacy team.  She should not discontinue Dupixent .

## 2024-01-05 ENCOUNTER — Other Ambulatory Visit (HOSPITAL_COMMUNITY): Payer: Self-pay

## 2024-01-05 ENCOUNTER — Other Ambulatory Visit: Payer: Self-pay

## 2024-01-05 ENCOUNTER — Ambulatory Visit
Admission: RE | Admit: 2024-01-05 | Discharge: 2024-01-05 | Disposition: A | Discharge status: Home / Self Care | Source: Ambulatory Visit | Attending: Nurse Practitioner | Admitting: Nurse Practitioner

## 2024-01-05 DIAGNOSIS — Z1231 Encounter for screening mammogram for malignant neoplasm of breast: Secondary | ICD-10-CM | POA: Insufficient documentation

## 2024-01-05 NOTE — Progress Notes (Signed)
 Spoke to patient - see addendum on patient message from 01/04/24. Dr. Tamea aware of patient's wishes to stop taking Dupixent . See separate thread for further details.

## 2024-01-05 NOTE — Telephone Encounter (Signed)
 Noted.  Will address on follow-up.  Her asthma has been challenging to treat.

## 2024-01-05 NOTE — Telephone Encounter (Signed)
 Noted. Nothing further needed.

## 2024-01-08 ENCOUNTER — Ambulatory Visit: Payer: Self-pay | Admitting: Nurse Practitioner

## 2024-01-11 ENCOUNTER — Other Ambulatory Visit: Payer: Self-pay

## 2024-01-11 NOTE — Telephone Encounter (Signed)
 I have not discontinued anything.  The medication is something that she would have to be taking long-term.  It is not meant to be done for a short-term period.

## 2024-01-11 NOTE — Progress Notes (Signed)
 Called & spoke with patient. Trying to reach out to fill medication per Patient call message from [facility]. Patient do not want to fill medication & hung up. Rph Holly aware & she will reach out to [employer].

## 2024-01-11 NOTE — Progress Notes (Signed)
 Patient returned my call. She reports she was confused. She is now agreeable to fill Dupixent . She is undecided on whether to continue Dupixent  in 2026.   Ultimately, she reports she will continue Dupixent  for now.   I repeated back to patient that I was understanding her to say that she would like our pharmacy to continue to contact her for Dupixent  refills and into the new year, 2026, unless we are advised otherwise by the patient. She confirms this is her intent.

## 2024-01-11 NOTE — Progress Notes (Signed)
 Inactivated in error. Re-enrolled in therapy plan - patient would like to continue Dupixent  through 02/17/24. Will discontinue at that time.

## 2024-01-12 ENCOUNTER — Other Ambulatory Visit: Payer: Self-pay

## 2024-01-12 NOTE — Progress Notes (Signed)
 Specialty Pharmacy Ongoing Clinical Assessment Note  Meghan Leon is a 68 y.o. female who is being followed by the specialty pharmacy service for RxSp Asthma/COPD   Patient's specialty medication(s) reviewed today: Dupilumab  (Dupixent )   Missed doses in the last 4 weeks: 0   Patient/Caregiver did not have any additional questions or concerns.   Therapeutic benefit summary: Patient is achieving benefit   Adverse events/side effects summary: Experienced adverse events/side effects (itchy watery eyes and itchy nose, was counseled by Aleck Puls and myself to use preservative free eye-drops when needed for dry eyes and saline nasal spray when needed, patient expressed understanding)   Patient's therapy is appropriate to: Continue    Goals Addressed             This Visit's Progress    Minimize recurrence of flares   On track    Patient is on track. Patient will be monitored by provider to determine if a change in treatment plan is warranted         Follow up: 12 months  Meghan Leon Specialty Pharmacist

## 2024-01-12 NOTE — Progress Notes (Signed)
 Specialty Pharmacy Refill Coordination Note  Meghan Leon is a 68 y.o. female contacted today regarding refills of specialty medication(s) Dupilumab  (Dupixent )   Patient requested Delivery   Delivery date: 01/13/24   Verified address: Patient address [address]  [place] [address]   Medication will be filled on: 01/12/24

## 2024-01-13 ENCOUNTER — Other Ambulatory Visit (HOSPITAL_COMMUNITY): Payer: Self-pay

## 2024-02-02 ENCOUNTER — Encounter: Payer: Self-pay | Admitting: Nurse Practitioner

## 2024-02-02 ENCOUNTER — Ambulatory Visit: Payer: Medicare Other | Admitting: Nurse Practitioner

## 2024-02-02 VITALS — BP 110/80 | HR 88 | Temp 97.6°F | Ht 63.25 in | Wt 188.6 lb

## 2024-02-02 DIAGNOSIS — Z0000 Encounter for general adult medical examination without abnormal findings: Secondary | ICD-10-CM

## 2024-02-02 DIAGNOSIS — I10 Essential (primary) hypertension: Secondary | ICD-10-CM | POA: Diagnosis not present

## 2024-02-02 DIAGNOSIS — R7303 Prediabetes: Secondary | ICD-10-CM

## 2024-02-02 DIAGNOSIS — I700 Atherosclerosis of aorta: Secondary | ICD-10-CM

## 2024-02-02 DIAGNOSIS — E669 Obesity, unspecified: Secondary | ICD-10-CM

## 2024-02-02 DIAGNOSIS — E785 Hyperlipidemia, unspecified: Secondary | ICD-10-CM

## 2024-02-02 DIAGNOSIS — D869 Sarcoidosis, unspecified: Secondary | ICD-10-CM

## 2024-02-02 DIAGNOSIS — J45909 Unspecified asthma, uncomplicated: Secondary | ICD-10-CM | POA: Diagnosis not present

## 2024-02-02 LAB — COMPREHENSIVE METABOLIC PANEL WITH GFR
ALT: 15 U/L (ref 0–35)
AST: 17 U/L (ref 5–37)
Albumin: 4.1 g/dL (ref 3.5–5.2)
Alkaline Phosphatase: 123 U/L — ABNORMAL HIGH (ref 39–117)
BUN: 10 mg/dL (ref 6–23)
CO2: 29 meq/L (ref 19–32)
Calcium: 9.4 mg/dL (ref 8.4–10.5)
Chloride: 105 meq/L (ref 96–112)
Creatinine, Ser: 0.93 mg/dL (ref 0.40–1.20)
GFR: 63.14 mL/min (ref >60.00)
Glucose, Bld: 102 mg/dL — ABNORMAL HIGH (ref 70–99)
Potassium: 3.9 meq/L (ref 3.5–5.1)
Sodium: 140 meq/L (ref 135–145)
Total Bilirubin: 0.4 mg/dL (ref 0.2–1.2)
Total Protein: 7.3 g/dL (ref 6.0–8.3)

## 2024-02-02 LAB — CBC WITH DIFFERENTIAL/PLATELET
Basophils Absolute: 0 K/uL (ref 0.0–0.1)
Basophils Relative: 0.5 % (ref 0.0–3.0)
Eosinophils Absolute: 0.8 K/uL — ABNORMAL HIGH (ref 0.0–0.7)
Eosinophils Relative: 8.8 % — ABNORMAL HIGH (ref 0.0–5.0)
HCT: 42 % (ref 36.0–46.0)
Hemoglobin: 13.8 g/dL (ref 12.0–15.0)
Lymphocytes Relative: 32.6 % (ref 12.0–46.0)
Lymphs Abs: 3 K/uL (ref 0.7–4.0)
MCHC: 32.8 g/dL (ref 30.0–36.0)
MCV: 76.9 fl — ABNORMAL LOW (ref 78.0–100.0)
Monocytes Absolute: 0.6 K/uL (ref 0.1–1.0)
Monocytes Relative: 6.4 % (ref 3.0–12.0)
Neutro Abs: 4.7 K/uL (ref 1.4–7.7)
Neutrophils Relative %: 51.7 % (ref 43.0–77.0)
Platelets: 297 K/uL (ref 150.0–400.0)
RBC: 5.46 Mil/uL — ABNORMAL HIGH (ref 3.87–5.11)
RDW: 15.3 % (ref 11.5–15.5)
WBC: 9.2 K/uL (ref 4.0–10.5)

## 2024-02-02 LAB — TSH: TSH: 1.8 u[IU]/mL (ref 0.35–5.50)

## 2024-02-02 LAB — LIPID PANEL
Cholesterol: 226 mg/dL — ABNORMAL HIGH (ref 28–200)
HDL: 42.2 mg/dL (ref >39.00)
LDL Cholesterol: 165 mg/dL — ABNORMAL HIGH (ref 0–99)
NonHDL: 184.13
Total CHOL/HDL Ratio: 5
Triglycerides: 94 mg/dL (ref 0.0–149.0)
VLDL: 18.8 mg/dL (ref 0.0–40.0)

## 2024-02-02 LAB — HEMOGLOBIN A1C: Hgb A1c MFr Bld: 6.3 % (ref 4.6–6.5)

## 2024-02-02 NOTE — Assessment & Plan Note (Signed)
 Incidental finding on CT scan of chest.  Patient has elevated cholesterol levels in the past.  Pending lipid panel today.  Patient has declined cholesterol medication in the past

## 2024-02-02 NOTE — Assessment & Plan Note (Signed)
 History of the same.  Patient has declined cholesterol medication in the past.  Did show sclerosis of the aorta on CT scan.  Did discuss this with patient in office

## 2024-02-02 NOTE — Assessment & Plan Note (Signed)
 History of the same.  Pending A1c, TSH, lipid panel.

## 2024-02-02 NOTE — Assessment & Plan Note (Signed)
 History of the same followed by pulmonology.  Did review CT scan from 08/19/2022

## 2024-02-02 NOTE — Assessment & Plan Note (Signed)
 History of the same.  Encouraged regular exercise pending A1c

## 2024-02-02 NOTE — Progress Notes (Signed)
 Established Patient Office Visit  Subjective   Patient ID: Meghan Leon, female    DOB: 1956-02-17  Age: 68 y.o. MRN: [id number]  Chief Complaint  Patient presents with   Annual Exam    HPI  HTN: Patient currently maintained on amlodipine  5 mg daily. Can check Bp at home if needed.  Asthma: Patient currently followed by pulmonology maintained on Dupixent  300 mg every 14 days and Trelegy. States that the albuterol  infrequently.  Aortic Atherosclerosis: noticed on CT scan o f chest in 2023. Discusse din office in accordance with elevated cholesterol levels   Sarcoidosis: Currently followed by pulmonology  for complete physical and follow up of chronic conditions.  Immunizations: -Tetanus: Completed in 2022 -Influenza:11/2023 -Shingles: Completed Shingrix series -Pneumonia: Completed 2023  Diet: Fair diet. She is eating 2 meals and she does snack a lot. She will do breakfast and larger lunch. She does water and tea Exercise: No regular exercise. She does have a gym membership and she goes infrequently.  Currently watching her neighbors kids in the mornings which is her normal exercise time states school will be out on Friday and she should have time to start her exercise routine back  Eye exam: Completes annually. Glasses for driving   Dental exam: Completes semi-annually    Colonoscopy: Completed in 04/13/2015, repeat 10 years Lung Cancer Screening: N/A  Pap smear: Aged out and partial hysterectomy  Mammogram: 01/05/2024, repeat 1 year  DEXA: 02/12/2023, osteopenia.  Patient due 2026  Sleep: going to bed around 9 and getting up at 630 this week. Normally she is 1030-7. She does feel rested.    Review of Systems  Constitutional:  Negative for chills and fever.  Respiratory:  Negative for shortness of breath.   Cardiovascular:  Negative for chest pain and leg swelling.  Gastrointestinal:  Negative for abdominal pain, blood in stool, constipation, diarrhea, nausea  and vomiting.       BM every other day   Genitourinary:  Negative for dysuria and hematuria.  Neurological:  Negative for dizziness, tingling and headaches.  Psychiatric/Behavioral:  Negative for hallucinations and suicidal ideas.       Objective:     BP 110/80   Pulse 88   Temp 97.6 F (36.4 C) (Oral)   Ht 5' 3.25 (1.607 m)   Wt 188 lb 9.6 oz (85.5 kg)   SpO2 98%   BMI 33.15 kg/m  BP Readings from Last 3 Encounters:  02/02/24 110/80  12/18/23 136/84  10/27/23 (!) 142/90   Wt Readings from Last 3 Encounters:  02/02/24 188 lb 9.6 oz (85.5 kg)  12/18/23 193 lb (87.5 kg)  10/27/23 189 lb 9.6 oz (86 kg)   SpO2 Readings from Last 3 Encounters:  02/02/24 98%  12/18/23 97%  10/27/23 97%      Physical Exam Vitals and nursing note reviewed.  Constitutional:      Appearance: Normal appearance.  HENT:     Right Ear: Tympanic membrane, ear canal and external ear normal.     Left Ear: Tympanic membrane, ear canal and external ear normal.     Mouth/Throat:     Mouth: Mucous membranes are moist.     Pharynx: Oropharynx is clear.  Eyes:     Extraocular Movements: Extraocular movements intact.     Pupils: Pupils are equal, round, and reactive to light.  Cardiovascular:     Rate and Rhythm: Normal rate and regular rhythm.     Pulses: Normal pulses.  Heart sounds: Normal heart sounds.  Pulmonary:     Effort: Pulmonary effort is normal.     Breath sounds: Normal breath sounds.  Abdominal:     General: Bowel sounds are normal. There is no distension.     Palpations: There is no mass.     Tenderness: There is no abdominal tenderness.     Hernia: No hernia is present.  Musculoskeletal:     Right lower leg: No edema.     Left lower leg: No edema.  Lymphadenopathy:     Cervical: No cervical adenopathy.  Skin:    General: Skin is warm.  Neurological:     General: No focal deficit present.     Mental Status: She is alert.     Deep Tendon Reflexes:     Reflex  Scores:      Bicep reflexes are 2+ on the right side and 2+ on the left side.      Patellar reflexes are 2+ on the right side and 2+ on the left side.    Comments: Bilateral upper and lower extremity strength 5/5  Psychiatric:        Mood and Affect: Mood normal.        Behavior: Behavior normal.        Thought Content: Thought content normal.        Judgment: Judgment normal.      No results found for any visits on 02/02/24.    The 10-year ASCVD risk score (Arnett DK, et al., 2019) is: 8.8%    Assessment & Plan:   Problem List Items Addressed This Visit       Cardiovascular and Mediastinum   Essential hypertension   Patient amlodipine  5 g daily.  Blood pressure controlled.  Continue medication as prescribed      Relevant Orders   Comprehensive metabolic panel with GFR   CBC with Differential/Platelet   Hemoglobin A1c   Lipid panel   Aortic atherosclerosis   Incidental finding on CT scan of chest.  Patient has elevated cholesterol levels in the past.  Pending lipid panel today.  Patient has declined cholesterol medication in the past        Respiratory   Asthma   History of the same.  Patient is followed by pulmonology.  Maintained on Dupixent  and Trelegy along with albuterol  as needed.  Patient states she uses albuterol  infrequently.  Continue medication as prescribed follow-up with specialist as recommended        Other   Hyperlipidemia   History of the same.  Patient has declined cholesterol medication in the past.  Did show sclerosis of the aorta on CT scan.  Did discuss this with patient in office      Relevant Orders   Hemoglobin A1c   Lipid panel   Sarcoidosis   History of the same followed by pulmonology.  Did review CT scan from 08/19/2022      Preventative health care - Primary   Discussed age-appropriate immunizations and screening exams.  Did review patient's personal, surgical, social, family histories.  Patient is up-to-date on all  age-appropriate vaccinations she would like.  Patient is up-to-date on CRC screening.  Breast cancer screening.  And bone density testing.  Patient is aged out of cervical cancer screening.  Patient was given information at discharge about preventative healthcare maintenance with anticipatory guidance.      Relevant Orders   Comprehensive metabolic panel with GFR   CBC with Differential/Platelet   TSH  Obesity (BMI 30-39.9)   History of the same.  Pending A1c, TSH, lipid panel.      Relevant Orders   Hemoglobin A1c   Lipid panel   Prediabetes   History of the same.  Encouraged regular exercise pending A1c      Relevant Orders   Hemoglobin A1c   Lipid panel    Return in about 1 year (around 02/01/2025) for CPE and Labs.    Adina Crandall, NP

## 2024-02-02 NOTE — Assessment & Plan Note (Signed)
 Discussed age-appropriate immunizations and screening exams.  Did review patient's personal, surgical, social, family histories.  Patient is up-to-date on all age-appropriate vaccinations she would like.  Patient is up-to-date on CRC screening.  Breast cancer screening.  And bone density testing.  Patient is aged out of cervical cancer screening.  Patient was given information at discharge about preventative healthcare maintenance with anticipatory guidance.

## 2024-02-02 NOTE — Assessment & Plan Note (Signed)
 History of the same.  Patient is followed by pulmonology.  Maintained on Dupixent  and Trelegy along with albuterol  as needed.  Patient states she uses albuterol  infrequently.  Continue medication as prescribed follow-up with specialist as recommended

## 2024-02-02 NOTE — Patient Instructions (Signed)
Nice to see you today I will be in touch with the labs once I have them Follow up with me in 1 year, sooner If you need me  

## 2024-02-02 NOTE — Assessment & Plan Note (Signed)
 Patient amlodipine  5 g daily.  Blood pressure controlled.  Continue medication as prescribed

## 2024-02-04 ENCOUNTER — Ambulatory Visit: Payer: Self-pay | Admitting: Nurse Practitioner

## 2024-02-08 ENCOUNTER — Other Ambulatory Visit: Payer: Self-pay

## 2024-02-08 NOTE — Progress Notes (Signed)
 Specialty Pharmacy Refill Coordination Note  Meghan Leon is a 68 y.o. female contacted today regarding refills of specialty medication(s) Dupilumab  (Dupixent )   Patient requested Delivery   Delivery date: 02/09/24   Verified address: Patient address [address]  [place] [address]   Medication will be filled on: 02/08/24

## 2024-03-01 ENCOUNTER — Other Ambulatory Visit: Payer: Self-pay | Admitting: Pharmacy Technician

## 2024-03-01 ENCOUNTER — Other Ambulatory Visit: Payer: Self-pay

## 2024-03-01 ENCOUNTER — Other Ambulatory Visit: Payer: Self-pay | Admitting: Pulmonary Disease

## 2024-03-01 ENCOUNTER — Encounter (HOSPITAL_COMMUNITY): Payer: Self-pay

## 2024-03-01 ENCOUNTER — Other Ambulatory Visit (HOSPITAL_COMMUNITY): Payer: Self-pay

## 2024-03-01 ENCOUNTER — Other Ambulatory Visit: Payer: Self-pay | Admitting: Nurse Practitioner

## 2024-03-01 DIAGNOSIS — J4540 Moderate persistent asthma, uncomplicated: Secondary | ICD-10-CM

## 2024-03-01 DIAGNOSIS — I10 Essential (primary) hypertension: Secondary | ICD-10-CM

## 2024-03-01 MED ORDER — DUPIXENT 300 MG/2ML ~~LOC~~ SOAJ
300.0000 mg | SUBCUTANEOUS | 2 refills | Status: DC
  Filled 2024-03-01: qty 4, 28d supply, fill #0

## 2024-03-01 NOTE — Telephone Encounter (Signed)
 Received message from specialty pharmacy that patient is requesting renewal of Dupixent .   See patient message today - patient notes benefit of Dupixent  and would like to continue therapy.   Next OV with Dr. Tamea: 03/22/24   Refill for Dupixent  sent to [facility]

## 2024-03-01 NOTE — Progress Notes (Signed)
 Specialty Pharmacy Refill Coordination Note  Meghan Leon is a 69 y.o. female contacted today regarding refills of specialty medication(s) Dupilumab  (Dupixent )   Patient requested [name] at [facility] at [place] date: 03/01/24   Medication will be filled on: 03/01/24

## 2024-03-02 ENCOUNTER — Other Ambulatory Visit: Payer: Self-pay

## 2024-03-02 MED FILL — Amlodipine Besylate Tab 5 MG (Base Equivalent): ORAL | 90 days supply | Qty: 90 | Fill #0 | Status: AC

## 2024-03-05 ENCOUNTER — Other Ambulatory Visit: Payer: Self-pay

## 2024-03-17 NOTE — Addendum Note (Signed)
 Addended by: Kelwin Gibler L on: 03/17/2024 12:33 PM   Modules accepted: Orders

## 2024-03-22 ENCOUNTER — Other Ambulatory Visit: Payer: Self-pay

## 2024-03-22 ENCOUNTER — Ambulatory Visit: Admitting: Pulmonary Disease

## 2024-03-22 ENCOUNTER — Encounter: Payer: Self-pay | Admitting: Pulmonary Disease

## 2024-03-22 VITALS — BP 116/86 | HR 84 | Temp 98.0°F | Ht 63.25 in | Wt 193.6 lb

## 2024-03-22 DIAGNOSIS — J4540 Moderate persistent asthma, uncomplicated: Secondary | ICD-10-CM

## 2024-03-22 DIAGNOSIS — D869 Sarcoidosis, unspecified: Secondary | ICD-10-CM | POA: Diagnosis not present

## 2024-03-22 DIAGNOSIS — J329 Chronic sinusitis, unspecified: Secondary | ICD-10-CM

## 2024-03-22 LAB — NITRIC OXIDE: Nitric Oxide: 19

## 2024-03-22 MED ORDER — FLUTICASONE-SALMETEROL 250-50 MCG/ACT IN AEPB
1.0000 | INHALATION_SPRAY | Freq: Two times a day (BID) | RESPIRATORY_TRACT | 11 refills | Status: AC
  Filled 2024-03-22: qty 60, 30d supply, fill #0

## 2024-03-22 NOTE — Patient Instructions (Signed)
 VISIT SUMMARY:  During your visit, we discussed the management of your moderate persistent asthma. You have been using Trelegy, but due to cost considerations and some ongoing symptoms, we decided to transition you back to Advair . We also addressed your recent dizziness and sinus reaction.  YOUR PLAN:  -MODERATE PERSISTENT ASTHMA: Moderate persistent asthma is a condition where the airways in your lungs are inflamed and narrowed, causing difficulty in breathing. We have decided to discontinue Trelegy and prescribe Advair , which you will take as one puff twice daily. Remember to rinse your mouth after using Advair  to prevent any potential side effects. Continue using Dupixent  as prescribed, as it has been effective in controlling inflammation and improving your symptoms. Additionally, use a scarf to protect your airways from cold weather.  INSTRUCTIONS:  We have sent your Advair  prescription to [facility]. Please schedule a follow-up appointment in three months to monitor your progress.

## 2024-03-23 ENCOUNTER — Ambulatory Visit

## 2024-03-23 ENCOUNTER — Other Ambulatory Visit: Payer: Self-pay

## 2024-03-23 ENCOUNTER — Other Ambulatory Visit (HOSPITAL_COMMUNITY): Payer: Self-pay

## 2024-03-23 DIAGNOSIS — Z79899 Other long term (current) drug therapy: Secondary | ICD-10-CM

## 2024-03-23 DIAGNOSIS — J4540 Moderate persistent asthma, uncomplicated: Secondary | ICD-10-CM

## 2024-03-23 MED ORDER — DUPIXENT 300 MG/2ML ~~LOC~~ SOAJ
300.0000 mg | SUBCUTANEOUS | 1 refills | Status: AC
  Filled 2024-03-23: qty 4, 28d supply, fill #0

## 2024-03-23 NOTE — Progress Notes (Addendum)
 Specialty Pharmacy Refill Coordination Note  Meghan Leon is a 69 y.o. female contacted today regarding refills of specialty medication(s) Dupilumab  (Dupixent )   Patient requested [name] at [facility] at [place] date: 03/29/24   Medication will be filled on: 03/28/24  Patient aware of $572.48 copayment d/t remaining deductible amount.

## 2024-03-23 NOTE — Progress Notes (Signed)
 [facility] - Continuation of Therapy with Biologic  Referring Provider: Dedra Sanders (received verbal authorization for referral)  Virtual Visit via Telephone Note  I connected with Meghan Leon List on 03/23/24 at  3:40 PM EST by telephone and verified that I am speaking with the correct person using two identifiers.  Location: Patient: home Provider: office   I discussed the limitations, risks, security and privacy concerns of performing an evaluation and management service by telephone and the availability of in person appointments. I also discussed with the patient that there may be a patient responsible charge related to this service. The patient expressed understanding and agreed to proceed.  HPI: Meghan Leon is a 69 y.o. female who presents to the pharmacotherapy clinic via telephone for continuation of therapy with Dupixent . Last OV with Dr. Sanders was on 03/22/24.   Indication: asthma Dosing: 300mg  every 2 weeks  Patient's current respiratory regimen: Advair   Patient Active Problem List   Diagnosis Date Noted   Aortic atherosclerosis 02/02/2024   Prediabetes 01/29/2023   Preventative health care 02/11/2022   Obesity (BMI 30-39.9) 02/11/2022   Sarcoidosis 08/04/2019   Essential hypertension 04/06/2019   Asthma 04/06/2019   Hyperlipidemia 04/06/2019    Patient's Medications  New Prescriptions   No medications on file  Previous Medications   ALBUTEROL  (PROVENTIL ) (2.5 MG/3ML) 0.083% NEBULIZER SOLUTION    Take 3 mLs (2.5 mg total) by nebulization every 6 (six) hours as needed for wheezing or shortness of breath.   ALBUTEROL  (VENTOLIN  HFA) 108 (90 BASE) MCG/ACT INHALER    Inhale 2 puffs into the lungs every 6 (six) hours as needed for wheezing or shortness of breath.   AMLODIPINE  (NORVASC ) 5 MG TABLET    Take 1 tablet (5 mg total) by mouth daily.   FLUTICASONE -SALMETEROL (ADVAIR  DISKUS) 250-50 MCG/ACT AEPB    Inhale 1 puff into the lungs in  the morning and at bedtime.  Modified Medications   Modified Medication Previous Medication   DUPILUMAB  (DUPIXENT ) 300 MG/2ML SOAJ Dupilumab  (DUPIXENT ) 300 MG/2ML SOAJ      Inject 300 mg into the skin every 14 (fourteen) days.    Inject 300 mg into the skin every 14 (fourteen) days.  Discontinued Medications   No medications on file    Allergies: Allergies[1]  Past Medical History: Past Medical History:  Diagnosis Date   Allergy 15 years ago   chemical exposure   Asthma    Hypertension January 2021   ED visit   Medical history non-contributory     Social History: Social History   Socioeconomic History   Marital status: Married    Spouse name: Meghan Leon   Number of children: 2   Years of education: college   Highest education level: Not on file  Occupational History   Not on file  Tobacco Use   Smoking status: Never   Smokeless tobacco: Never   Tobacco comments:    never  Vaping Use   Vaping status: Never Used  Substance and Sexual Activity   Alcohol use: No   Drug use: No   Sexual activity: Yes    Birth control/protection: Surgical  Other Topics Concern   Not on file  Social History Narrative   04/06/19   From: the area   Living: with husband Meghan Leon, and adult daughter    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      Family: Engineer, Manufacturing - daughter, grandson GLENWOOD Riis Apr 10, 2018) (her daughter passed away)  Enjoys: walking, traveling, [place], cruises       Exercise: walks when the weather is nice   Diet: eating more since working from home      Safety   Seat belts: Yes    Guns: No   Safe in relationships: Yes    Social Drivers of Health   Tobacco Use: Low Risk (03/22/2024)   Patient History    Smoking Tobacco Use: Never    Smokeless Tobacco Use: Never    Passive Exposure: Not on file  Financial Resource Strain: Low Risk (04/10/2023)   Overall Financial Resource Strain (CARDIA)    Difficulty of Paying Living Expenses: Not very hard  Food  Insecurity: No Food Insecurity (04/10/2023)   Hunger Vital Sign    Worried About Running Out of Food in the Last Year: Never true    Ran Out of Food in the Last Year: Never true  Transportation Needs: No Transportation Needs (04/10/2023)   PRAPARE - Administrator, Civil Service (Medical): No    Lack of Transportation (Non-Medical): No  Physical Activity: Insufficiently Active (04/10/2023)   Exercise Vital Sign    Days of Exercise per Week: 2 days    Minutes of Exercise per Session: 40 min  Stress: No Stress Concern Present (04/10/2023)   [employer] of Occupational Health - Occupational Stress Questionnaire    Feeling of Stress : Not at all  Social Connections: Moderately Integrated (04/10/2023)   Social Connection and Isolation Panel    Frequency of Communication with Friends and Family: More than three times a week    Frequency of Social Gatherings with Friends and Family: More than three times a week    Attends Religious Services: More than 4 times per year    Active Member of Clubs or Organizations: No    Attends Banker Meetings: Never    Marital Status: Married  Depression (PHQ2-9): Low Risk (04/10/2023)   Depression (PHQ2-9)    PHQ-2 Score: 0  Alcohol Screen: Low Risk (04/10/2023)   Alcohol Screen    Last Alcohol Screening Score (AUDIT): 0  Housing: Unknown (04/10/2023)   Housing Stability Vital Sign    Unable to Pay for Housing in the Last Year: No    Number of Times Moved in the Last Year: Not on file    Homeless in the Last Year: No  Utilities: Not At Risk (04/10/2023)   AHC Utilities    Threatened with loss of utilities: No  Health Literacy: Adequate Health Literacy (04/10/2023)   B1300 Health Literacy    Frequency of need for help with medical instructions: Never      Assessment/Plan: 1. Patient prescribed Dupixent  for asthma. Reviewed the medication with the patient, including the following:   Goals of therapy: Mechanism: monoclonal  antibody used for the treatment of asthma or asthma/COPD overlap Reviewed that Dupixent  is add-on medication and patient must continue maintenance inhaler regimen. Response to therapy: may take 3-4 months to determine efficacy.  Side effects: injection site reaction, antibody development, arthralgia, ocular effects  Dose: Dupixent  300mg  once every 2 weeks  Administration/Storage:  Administer as a SubQ injection and rotate sites.  Keep medication in refrigerator, do not freeze.  Allow the medication to reach room temp prior to administration (45 mins for 300 mg syringe or 30 min for 200 mg syringe). Do not shake.   Plan:  - Patient will continue injections every 2 weeks at home.  - Rx will be triaged to [facility] for patient to pick up.   I discussed the assessment and treatment plan with the patient. The patient was provided an opportunity to ask questions and all were answered. The patient agreed with the plan and demonstrated an understanding of the instructions.   The patient was advised to call back or seek an in-person evaluation if the symptoms worsen or if the condition fails to improve as anticipated.  I provided 15 minutes of non-face-to-face time during this encounter.  Patient verbalizes understanding and agreement with plan.   Delon Brow, PharmD, CSP, AAHIVP, CPP Clinical Pharmacist Practitioner - Medication Therapy Disease Management/Specialty Pharmacy Services 03/23/2024, 3:59 PM     [1] No Known Allergies

## 2024-04-12 ENCOUNTER — Ambulatory Visit: Payer: Medicare Other

## 2024-06-21 ENCOUNTER — Ambulatory Visit: Admitting: Pulmonary Disease
